# Patient Record
Sex: Female | Born: 1980 | Race: White | Hispanic: No | Marital: Married | Smoking: Never smoker
Health system: Southern US, Community
[De-identification: ages and names within clinical notes are randomized; demographics above are authoritative.]

## PROBLEM LIST (undated history)

## (undated) DIAGNOSIS — F419 Anxiety disorder, unspecified: Secondary | ICD-10-CM

## (undated) DIAGNOSIS — G40909 Epilepsy, unspecified, not intractable, without status epilepticus: Secondary | ICD-10-CM

## (undated) HISTORY — DX: Anxiety disorder, unspecified: F41.9

## (undated) HISTORY — PX: WISDOM TOOTH EXTRACTION: SHX21

## (undated) HISTORY — PX: OTHER SURGICAL HISTORY: SHX169

## (undated) HISTORY — DX: Epilepsy, unspecified, not intractable, without status epilepticus: G40.909

---

## 2006-07-19 ENCOUNTER — Other Ambulatory Visit: Admission: RE | Admit: 2006-07-19 | Discharge: 2006-07-19 | Payer: Self-pay | Admitting: Obstetrics & Gynecology

## 2007-11-09 ENCOUNTER — Other Ambulatory Visit: Admission: RE | Admit: 2007-11-09 | Discharge: 2007-11-09 | Payer: Self-pay | Admitting: Obstetrics & Gynecology

## 2013-04-26 ENCOUNTER — Encounter: Payer: Self-pay | Admitting: Obstetrics & Gynecology

## 2013-04-27 ENCOUNTER — Ambulatory Visit (INDEPENDENT_AMBULATORY_CARE_PROVIDER_SITE_OTHER): Payer: BC Managed Care – PPO | Admitting: Obstetrics & Gynecology

## 2013-04-27 ENCOUNTER — Encounter: Payer: Self-pay | Admitting: Obstetrics & Gynecology

## 2013-04-27 VITALS — BP 110/78 | HR 70 | Resp 18 | Ht 63.5 in | Wt 140.5 lb

## 2013-04-27 DIAGNOSIS — Z01419 Encounter for gynecological examination (general) (routine) without abnormal findings: Secondary | ICD-10-CM

## 2013-04-27 DIAGNOSIS — Z0000 Encounter for general adult medical examination without abnormal findings: Secondary | ICD-10-CM

## 2013-04-27 LAB — POCT URINALYSIS DIPSTICK
Blood, UA: NEGATIVE
Nitrite, UA: NEGATIVE
Protein, UA: NEGATIVE
Urobilinogen, UA: NEGATIVE

## 2013-04-27 NOTE — Progress Notes (Signed)
Patient ID: Madeline Figueroa, female   DOB: 1981/01/26, 32 y.o.   MRN: [id number]  32 y.o. G1P1 SingleCaucasianF here for annual exam.  Hasn't decided to proceed with trying for another pregnancy.  Daughter has an egg and peanut allergy.  Cycling a little bit about every 40 days.  Light flow.  No recent seizure activity.  Patient's last menstrual period was 03/15/2013.          Sexually active: yes  The current method of family planning is IUD--Mirena   Exercising: yes  Walks 30 minutes daily. Smoker:  no  Health Maintenance: Pap:  02-22-12 wnl:neg HR HPV History of abnormal Pap:  no MMG:  N/A Colonoscopy:  N/A BMD:   N/A TDaP:  2012 Screening Labs: none today, Hb today: 13.4, Urine today: Neg   reports that she has never smoked. She has never used smokeless tobacco. She reports that she drinks about 1.5 ounces of alcohol per week. She reports that she does not use illicit drugs.  Past Medical History  Diagnosis Date  . Epilepsy   . Anxiety     h/o anxiety/OCD    Past Surgical History  Procedure Laterality Date  . Wisdom tooth extraction    . Excision of wart      from hand, age 38  . Mirena      IUD removal 2011, reinsertion 2013  . Cesarean section      secondary FTP    Current Outpatient Prescriptions  Medication Sig Dispense Refill  . clonazePAM (KLONOPIN) 0.5 MG tablet Take 0.5 mg by mouth at bedtime.      . levETIRAcetam (KEPPRA) 250 MG tablet Take 250 mg by mouth. Taking 6 tabs BID      . levonorgestrel (MIRENA) 20 MCG/24HR IUD 1 each by Intrauterine route once.       No current facility-administered medications for this visit.    Family History  Problem Relation Age of Onset  . Skin cancer Father   . Skin cancer Mother   . Hypertension Mother   . Hypothyroidism Mother   . Asthma Mother   . Thyroid disease Mother   . Skin cancer Paternal Grandfather   . Osteoarthritis Paternal Grandfather   . Hypertension Maternal Grandmother   . Osteoporosis Maternal  Grandmother   . Osteoarthritis Maternal Grandmother   . Breast cancer Maternal Aunt   . Seizures Maternal Aunt   . Stroke Maternal Grandfather   . Osteoarthritis Paternal Grandmother     ROS:  Pertinent items are noted in HPI.  Otherwise, a comprehensive ROS was negative.  Exam:   BP 110/78  Pulse 70  Resp 18  Ht 5' 3.5" (1.613 m)  Wt 140 lb 8 oz (63.73 kg)  BMI 24.49 kg/m2  LMP 03/15/2013  Weight change: +7 lbs   Height: 5' 3.5" (161.3 cm)  Ht Readings from Last 3 Encounters:  04/27/13 5' 3.5" (1.613 m)    General appearance: alert, cooperative and appears stated age Head: Normocephalic, without obvious abnormality, atraumatic Neck: no adenopathy, supple, symmetrical, trachea midline and thyroid normal to inspection and palpation Lungs: clear to auscultation bilaterally Breasts: normal appearance, no masses or tenderness Heart: regular rate and rhythm Abdomen: soft, non-tender; bowel sounds normal; no masses,  no organomegaly Extremities: extremities normal, atraumatic, no cyanosis or edema Skin: Skin color, texture, turgor normal. No rashes or lesions Lymph nodes: Cervical, supraclavicular, and axillary nodes normal. No abnormal inguinal nodes palpated Neurologic: Grossly normal   Pelvic: External genitalia:  no  lesions              Urethra:  normal appearing urethra with no masses, tenderness or lesions              Bartholins and Skenes: normal                 Vagina: normal appearing vagina with normal color and discharge, no lesions              Cervix: no lesions              Pap taken: no Bimanual Exam:  Uterus:  normal size, contour, position, consistency, mobility, non-tender              Adnexa: normal adnexa               Rectovaginal: Confirms               Anus:  normal sphincter tone, no lesions  A:  Well Woman with normal exam Mirena placed 9/13 H/O anxiety/OCD H/O seizure disorder  P:   Mammogram starting age 66 pap smear with neg HR HPV 9/13 May  consider having IUD this year. return annually or prn  An After Visit Summary was printed and given to the patient.

## 2013-08-10 ENCOUNTER — Telehealth: Payer: Self-pay | Admitting: Obstetrics & Gynecology

## 2013-08-10 DIAGNOSIS — Z30431 Encounter for routine checking of intrauterine contraceptive device: Secondary | ICD-10-CM

## 2013-08-10 NOTE — Telephone Encounter (Signed)
Spoke with patient. Patient states that she would like to have her IUD removed so that her and her husband can begin trying to having another baby. States that she does not want IUD out prior to the end of May/ beginning of June due to a busy schedule. Order placed. Requesting early morning appointment.Appointment made for May 28th at 10:00 am with Dr.Miller. Patient verbalizes understanding and is agreeable.Ready for precert. Sabrina notified.  Routing to provider for final review. Patient agreeable to disposition. Will close encounter

## 2013-08-10 NOTE — Telephone Encounter (Signed)
Pt wants to schedule an appointment for IUD removal.

## 2013-10-05 ENCOUNTER — Ambulatory Visit (INDEPENDENT_AMBULATORY_CARE_PROVIDER_SITE_OTHER): Payer: BC Managed Care – PPO | Admitting: Obstetrics & Gynecology

## 2013-10-05 VITALS — BP 102/60 | HR 64 | Resp 16 | Ht 63.5 in | Wt 138.8 lb

## 2013-10-05 DIAGNOSIS — Z30432 Encounter for removal of intrauterine contraceptive device: Secondary | ICD-10-CM

## 2013-10-05 DIAGNOSIS — Z30431 Encounter for routine checking of intrauterine contraceptive device: Secondary | ICD-10-CM

## 2013-10-05 NOTE — Progress Notes (Signed)
33 y.o. G1P1 Married Caucasian female presents for IUD removal.  Pt is ready to start trying again.  She has had a couple of breakthrough seizures since Keppra dosage was lowered.  This was done after she stopped nursing.  Now dosage has been increased.  She is on 4mg  folic acid and a PNV.  She's been doing this about 6 weeks.   LMP:  Patient's last menstrual period was 09/14/2013.  Healthy female:  WNWD WF, NADPelvic exam: Vulva: normal female genitalia Vagina:normal vagina Cervix:Non-tender, Negative CMT, no lesions or redness, IUD strings seen Strings grasped with ringed forceps.  IUD removed without difficulty with one tug on strings.  IUD discarded. Uterus:normal shape, position and consistency   A: Removal of IUD today Seizure d/o  P: On 4mg  folic acid Will call when she has + pregnancy

## 2014-03-12 ENCOUNTER — Encounter: Payer: Self-pay | Admitting: Obstetrics & Gynecology

## 2014-04-02 ENCOUNTER — Telehealth: Payer: Self-pay | Admitting: Obstetrics & Gynecology

## 2014-04-02 DIAGNOSIS — Z30430 Encounter for insertion of intrauterine contraceptive device: Secondary | ICD-10-CM

## 2014-04-02 NOTE — Telephone Encounter (Signed)
Pt wants to schedule an appointment for an ultrasound

## 2014-04-02 NOTE — Telephone Encounter (Signed)
Spoke with patient. Patient states that she would like to have IUD replaced. Patient had IUD removed 10/05/2013 and was planning to try for pregnancy. "My husband and I have decided not to try right now and I would like to have my IUD replaced." Patient is currently having regular cycles monthly. Advised patient will need to call with first day of menses to get scheduled during the first 7 days of cycle for IUD insertion. Patient is agreeable. Advised order will be placed by Dr.Miller if approved and she will be contacted with OOP cost for IUD insertion. Patient is agreeable.   Dr.Miller, okay to place order for Mirena insertion?

## 2014-04-04 NOTE — Telephone Encounter (Signed)
Order placed for IUD insertion for precert. Patient aware to call with first day of next menses to schedule insertion.  Will close encounter.

## 2014-04-04 NOTE — Telephone Encounter (Signed)
Yes.  Agree with plan.  OK to close encounter.

## 2014-04-10 NOTE — Telephone Encounter (Signed)
PR: $0 °

## 2014-04-11 ENCOUNTER — Telehealth: Payer: Self-pay | Admitting: Obstetrics & Gynecology

## 2014-04-11 NOTE — Telephone Encounter (Signed)
Spoke with patient. Advised that per benefits quote received, IUD and insertion is covered at 100%. There will be 0 patient liability. Patient is to call within the first 5 days of her cycle to schedule insertion. °

## 2014-04-16 ENCOUNTER — Telehealth: Payer: Self-pay | Admitting: Obstetrics & Gynecology

## 2014-04-16 NOTE — Telephone Encounter (Signed)
Spoke with patient. Patient states that she started cycle today and would like to schedule appointment for IUD insertion. Appointment scheduled for tomorrow at 1pm with Dr.Miller (time per Kennon RoundsSally). Patient is agreeable to date and time.  Routing to provider for final review. Patient agreeable to disposition. Will close encounter

## 2014-04-16 NOTE — Telephone Encounter (Signed)
Pt scheduled for an iud and is calling to notify started cycle today.  bf

## 2014-04-16 NOTE — Telephone Encounter (Signed)
Left message to call Harlynn Kimbell at [phone].  Offer patient appointment tomorrow at 1pm with Dr.Miller for IUD insertion.

## 2014-04-17 ENCOUNTER — Ambulatory Visit: Payer: BLUE CROSS/BLUE SHIELD | Admitting: Obstetrics & Gynecology

## 2014-04-17 DIAGNOSIS — Z30430 Encounter for insertion of intrauterine contraceptive device: Secondary | ICD-10-CM

## 2014-04-17 NOTE — Progress Notes (Signed)
33 y.o. G1P1 Married Caucasian female presents for insertion of Mirena.  Pt having a lot of issues with break through seizures.  Feels like she is being an "experiment with medications".  Pt feels like just needs to be "sure" about her contraception right now.    She has been counseled about alternative forms but is not desirous of anything but Mirena IUD.  Currently, she denies any vaginal symptoms or STD concerns.  LMP:  Patient's last menstrual period was 04/16/2014.  Healthy female:  WNWD NAD  Pelvic exam: Vulva: normal female genitalia Vagina:normal vagina Cervix:Non-tender, Negative CMT, no lesions or redness Uterus:normal shape, position and consistency   After patient read information booklet and all questions were answered, informed consent was obtained.    Procedure:  Speculum inserted into vagina. Cervix visualized and cleansed with betadine solution X 3. Paracervical block was not placed.    Tenaculum placed on cervix at 12 o'clock position.  Uterus sounded to 9 centimeters.  IUD removed from sterile packet and under sterile conditions inserted to fundus of uterus.  Introducer removed without difficulty.  IUD string trimmed to 2 centimeters.  Remainder string given to patient to feel for identification.  Tenaculum removed.  minimal bleeding noted.  Speculum removed.  Uterus palpated normal.  Patient tolerated procedure well.  IUD Lot #:[id number].  Exp: 4/18.  Package information attached to consent and scanned into EPIC.  A: Insertion of Mirena IUD  P: Return to office 12 weeks for recheck Pt knows IUD needs to be replaced approximately 04/18/2019.  Instructions provided.

## 2014-04-17 NOTE — Progress Notes (Signed)
Patient took 800 mg of Ibuprofen at 1:35 pm

## 2014-04-20 ENCOUNTER — Ambulatory Visit (INDEPENDENT_AMBULATORY_CARE_PROVIDER_SITE_OTHER): Payer: BLUE CROSS/BLUE SHIELD | Admitting: Certified Nurse Midwife

## 2014-04-20 ENCOUNTER — Telehealth: Payer: Self-pay | Admitting: Obstetrics & Gynecology

## 2014-04-20 ENCOUNTER — Encounter: Payer: Self-pay | Admitting: Certified Nurse Midwife

## 2014-04-20 VITALS — BP 124/74 | HR 76 | Resp 16 | Ht 63.5 in | Wt 133.0 lb

## 2014-04-20 DIAGNOSIS — Z3049 Encounter for surveillance of other contraceptives: Secondary | ICD-10-CM

## 2014-04-20 DIAGNOSIS — N390 Urinary tract infection, site not specified: Secondary | ICD-10-CM

## 2014-04-20 MED ORDER — NITROFURANTOIN MONOHYD MACRO 100 MG PO CAPS: 100.0000 mg | ORAL_CAPSULE | Freq: Two times a day (BID) | ORAL | Status: DC

## 2014-04-20 NOTE — Telephone Encounter (Signed)
Spoke with patient. Patient had IUD inserted on 12/8 by Dr.Miller. Patient states she had slight cramping after IUD insertion which has gone away. Experienced sharp lower quadrant pain with laying down a couple of days ago that went away after she got up and moved around. Patient is unable to feel IUD strings.Since Wednesday patient has been experiencing increased urinary frequency. Denies any current pain, fevers, or chills. "My main concern is having to use the restroom all the time. I think I may have a UTI." Advised patient will need to be seen for evaluation. Patient is agreeable. Appointment scheduled for today at 2pm with Madeline Figueroa CNM. Patient is agreeable to date and time.  Cc: Dr.Miller  Routing to provider for final review. Patient agreeable to disposition. Will close encounter

## 2014-04-20 NOTE — Patient Instructions (Signed)

## 2014-04-20 NOTE — Progress Notes (Signed)
33 y.o. married white female g1p1001 here with complaint of UTI, with onset  3 days ago. Patient complaining of urinary frequency/urgency/ and discomfort wiith urination. Patient denies fever, chills, nausea or back pain. No new personal products. Patient feels not related to sexual activity. Denies any vaginal symptoms or new personal products. Contraception is Mirena. IUD Insertion 3 days ago.   O: Healthy female WDWN Affect: Normal, orientation x 3 Skin : warm and dry CVAT: negative bilateral Abdomen: positive for suprapubic tenderness  Pelvic exam: External genital area: normal, no lesions Bladder,Urethra, tender Urethral meatus: tender, slightly red Vagina: normal vaginal discharge, normal appearance  Wet prep Cervix: normal, non tender, IUD string noted appropriate length Uterus:normal,non tender Adnexa: normal non tender, no fullness or masses   A: UTI Poct urine-ph 8.0  Tr. Blood Mirena IUD  Normal surveillance P: Reviewed findings of symptoms consistent with  UTI NF:AOZHYQMVRx:Macrobid see order HQI:ONGEXLab:Urine micro, culture Reviewed warning signs and symptoms of UTI and need to advise if occurs.  Encouraged to limit soda, tea, and coffee and increase water intake daily.  RV 2 week if TOC needed for positive culture  RV prn

## 2014-04-20 NOTE — Telephone Encounter (Signed)
Patient calling requesting to speak with nurse about possible problems with her IUD.

## 2014-04-21 LAB — URINALYSIS, MICROSCOPIC ONLY
Bacteria, UA: NONE SEEN
Casts: NONE SEEN
Crystals: NONE SEEN
Squamous Epithelial / LPF: NONE SEEN

## 2014-04-22 LAB — URINE CULTURE
COLONY COUNT: NO GROWTH
ORGANISM ID, BACTERIA: NO GROWTH

## 2014-04-23 NOTE — Progress Notes (Signed)
Reviewed personally.  M. Suzanne Marbeth Smedley, MD.  

## 2014-05-24 ENCOUNTER — Ambulatory Visit (INDEPENDENT_AMBULATORY_CARE_PROVIDER_SITE_OTHER): Payer: BLUE CROSS/BLUE SHIELD | Admitting: Obstetrics & Gynecology

## 2014-05-24 ENCOUNTER — Encounter: Payer: Self-pay | Admitting: Obstetrics & Gynecology

## 2014-05-24 VITALS — BP 104/66 | HR 68 | Resp 16 | Ht 63.25 in | Wt 134.4 lb

## 2014-05-24 DIAGNOSIS — N390 Urinary tract infection, site not specified: Secondary | ICD-10-CM | POA: Insufficient documentation

## 2014-05-24 DIAGNOSIS — F428 Other obsessive-compulsive disorder: Secondary | ICD-10-CM | POA: Insufficient documentation

## 2014-05-24 DIAGNOSIS — N938 Other specified abnormal uterine and vaginal bleeding: Secondary | ICD-10-CM

## 2014-05-24 DIAGNOSIS — Z124 Encounter for screening for malignant neoplasm of cervix: Secondary | ICD-10-CM

## 2014-05-24 DIAGNOSIS — G40909 Epilepsy, unspecified, not intractable, without status epilepticus: Secondary | ICD-10-CM | POA: Insufficient documentation

## 2014-05-24 DIAGNOSIS — Z01419 Encounter for gynecological examination (general) (routine) without abnormal findings: Secondary | ICD-10-CM

## 2014-05-24 LAB — CBC WITH DIFFERENTIAL/PLATELET
Basophils Absolute: 0 10*3/uL (ref 0.0–0.1)
Basophils Relative: 0 % (ref 0–1)
EOS PCT: 1 % (ref 0–5)
Eosinophils Absolute: 0 10*3/uL (ref 0.0–0.7)
HCT: 43.5 % (ref 36.0–46.0)
HEMOGLOBIN: 14.5 g/dL (ref 12.0–15.0)
Lymphocytes Relative: 34 % (ref 12–46)
Lymphs Abs: 1.6 10*3/uL (ref 0.7–4.0)
MCH: 30.8 pg (ref 26.0–34.0)
MCHC: 33.3 g/dL (ref 30.0–36.0)
MCV: 92.4 fL (ref 78.0–100.0)
MPV: 11 fL (ref 8.6–12.4)
Monocytes Absolute: 0.4 10*3/uL (ref 0.1–1.0)
Monocytes Relative: 9 % (ref 3–12)
NEUTROS ABS: 2.6 10*3/uL (ref 1.7–7.7)
NEUTROS PCT: 56 % (ref 43–77)
Platelets: 182 10*3/uL (ref 150–400)
RBC: 4.71 MIL/uL (ref 3.87–5.11)
RDW: 12.8 % (ref 11.5–15.5)
WBC: 4.6 10*3/uL (ref 4.0–10.5)

## 2014-05-24 LAB — POCT URINALYSIS DIPSTICK
Bilirubin, UA: NEGATIVE
GLUCOSE UA: NEGATIVE
KETONES UA: NEGATIVE
Leukocytes, UA: NEGATIVE
NITRITE UA: NEGATIVE
Protein, UA: NEGATIVE
UROBILINOGEN UA: NEGATIVE
pH, UA: 5

## 2014-05-24 MED ORDER — FLUOXETINE HCL 10 MG PO TABS: 10.0000 mg | ORAL_TABLET | Freq: Every day | ORAL | Status: DC

## 2014-05-24 NOTE — Progress Notes (Signed)
Patient scheduled for Pel[facility] ultrasound [facility] in [place] for 05/25/14 at 1530. Patient given written order to take with her.

## 2014-05-24 NOTE — Progress Notes (Signed)
34 y.o. G1P1 MarriedCaucasianF here for annual exam.  Had a seizure 05/01/14 and she went off Keppra.  Depakote was increased.  Pt is going for a second opinion today.  Pt reports she had bleeding issues when her Depakote was increased.    Pt had IUD placed 04/17/14.  Pt has been bleeding/spotting since 05/11/14.  No pain.  No cramping.    Pt having some increased anxiety with driving.  Would like to consider starting something.  H/O OCD.  Usually does calming techniques but not helping with driving.  Recent increases in seizures is making her more anxious as well.  Still able to drive per neurologist recommendations.  Patient's last menstrual period was 05/11/2014.          Sexually active: Yes.    The current method of family planning is IUD.    Exercising: Yes.    walking, eliptical, and yoga Smoker:  no  Health Maintenance: Pap:  02/22/12 WNL/negative HR HPV History of abnormal Pap:  no MMG:  none Colonoscopy:  none BMD:   none TDaP:  2012 Screening Labs: CBC, Hb today: n/a, Urine today: RBC-trace   reports that she has never smoked. She has never used smokeless tobacco. She reports that she drinks about 1.2 - 1.8 oz of alcohol per week. She reports that she does not use illicit drugs.  Past Medical History  Diagnosis Date  . Epilepsy   . Anxiety     h/o anxiety/OCD    Past Surgical History  Procedure Laterality Date  . Wisdom tooth extraction    . Excision of wart      from hand, age 89  . Mirena      IUD removal 2011, reinsertion 2013-5/15, reinsertion 04/2014  . Cesarean section      secondary FTP    Current Outpatient Prescriptions  Medication Sig Dispense Refill  . divalproex (DEPAKOTE) 250 MG DR tablet Take 250 mg by mouth. Taking 750mg  twice daily     No current facility-administered medications for this visit.    Family History  Problem Relation Age of Onset  . Skin cancer Father   . Skin cancer Mother   . Hypertension Mother   . Hypothyroidism Mother   .  Asthma Mother   . Thyroid disease Mother   . Skin cancer Paternal Grandfather   . Osteoarthritis Paternal Grandfather   . Hypertension Maternal Grandmother   . Osteoporosis Maternal Grandmother   . Osteoarthritis Maternal Grandmother   . Breast cancer Maternal Aunt   . Seizures Maternal Aunt   . Stroke Maternal Grandfather   . Osteoarthritis Paternal Grandmother     ROS:  Pertinent items are noted in HPI.  Otherwise, a comprehensive ROS was negative.  Exam:   BP 104/66 mmHg  Pulse 68  Resp 16  Ht 5' 3.25" (1.607 m)  Wt 134 lb 6.4 oz (60.963 kg)  BMI 23.61 kg/m2  LMP 05/11/2014  Weight change: -6#  Height: 5' 3.25" (160.7 cm)  Ht Readings from Last 3 Encounters:  05/24/14 5' 3.25" (1.607 m)  04/20/14 5' 3.5" (1.613 m)  04/17/14 5' 3.5" (1.613 m)    General appearance: alert, cooperative and appears stated age Head: Normocephalic, without obvious abnormality, atraumatic Neck: no adenopathy, supple, symmetrical, trachea midline and thyroid normal to inspection and palpation Lungs: clear to auscultation bilaterally Breasts: normal appearance, no masses or tenderness Heart: regular rate and rhythm Abdomen: soft, non-tender; bowel sounds normal; no masses,  no organomegaly Extremities: extremities normal, atraumatic,  no cyanosis or edema Skin: Skin color, texture, turgor normal. No rashes or lesions Lymph nodes: Cervical, supraclavicular, and axillary nodes normal. No abnormal inguinal nodes palpated Neurologic: Grossly normal   Pelvic: External genitalia:  no lesions              Urethra:  normal appearing urethra with no masses, tenderness or lesions              Bartholins and Skenes: normal                 Vagina: normal appearing vagina with normal color and discharge, no lesions              Cervix: no lesions              Pap taken: Yes.   Bimanual Exam:  Uterus:  normal size, contour, position, consistency, mobility, non-tender              Adnexa: normal adnexa  and no mass, fullness, tenderness               Rectovaginal: Confirms               Anus:  normal sphincter tone, no lesions  Chaperone was present for exam.  A:  Well Woman with normal exam Mirena placed 12/15.  DUB. H/O anxiety/OCD H/O seizure disorder/epilepsy  P: Mammogram starting age 63 pap smear with neg HR HPV 9/13.  Pap obtained today. Plan PUS to check IUD placement due to DUB. Prozac  daily.  #30/12RF.  Pt will discuss with neurologist today. return annually or prn

## 2014-05-25 ENCOUNTER — Telehealth: Payer: Self-pay | Admitting: Obstetrics & Gynecology

## 2014-05-25 ENCOUNTER — Telehealth: Payer: Self-pay

## 2014-05-25 DIAGNOSIS — N938 Other specified abnormal uterine and vaginal bleeding: Secondary | ICD-10-CM

## 2014-05-25 NOTE — Telephone Encounter (Signed)
Patient calling with names of the tests that need processed with her labs yesterday per her neurologist, [place], [place] . DX Long term medication use. NEC (icd-z79.899) (icd-10 z79.899).  [facility] Phone: [phone] Fax: [phone]  Per patient: [id number] cpt 8502 cbc with differential and platelets Lav  Order #: [id number]

## 2014-05-25 NOTE — Telephone Encounter (Signed)
Patient was scheduled for an ultrasound today [facility]. Dianne from that office called and said the patient did not want to proceed with the visit due to the cost.   Patient got on the line and requests we check her insurance benefits so she can be seen here instead.   Patient also requests a call from the nurse to speak with her about "the medical necessity" of the appointment.   Cc: Martie LeeSabrina

## 2014-05-25 NOTE — Telephone Encounter (Signed)
Left detailed message to call back and notified of results, ok per DPR,. Need more information on lab request.//kn

## 2014-05-25 NOTE — Telephone Encounter (Signed)
Spoke with patient. Advised patient does need ultrasound to check placement of IUD. Advised this is important due to bleeding she has been having. Patient is agreeable. Advised order has been placed and will be precerted. Advised will receive all about OOP cost if she will owe anything more than standard office visit copay. Patient is agreeable. Advised patient of results as seen below. Patient is agreeable. States that she needed CBC with Differential and platelets done per neurologist. Provided information from order with neurology below. Advised patient did have CBC with differential at office visit which included platelet. Advised do not think anything further needs to be added but I will let Dr.Miller know. Patient requesting labs be sent to neurologist at fax number below. Labs faxed to number provided with cover sheet and fax confirmation. Starla Curl at 05/25/2014 2:22 PM     Status: Signed       Expand All Collapse All   Patient calling with names of the tests that need processed with her labs yesterday per her neurologist, [place], [place] . DX Long term medication use. NEC (icd-z79.899) (icd-10 z79.899).  [facility] Phone: [phone] Fax: [phone]  Per patient: [id number] cpt 8502 cbc with differential and platelets Lav  Order #: [id number]     Routing to provider for final review. Patient agreeable to disposition. Will close encounter

## 2014-05-25 NOTE — Telephone Encounter (Signed)
-----   Message from Annamaria BootsMary Suzanne Miller, MD sent at 05/25/2014 10:36 AM EST ----- Inform normal CBC.  She didn't call back with other labs that needed to be done.

## 2014-05-25 NOTE — Telephone Encounter (Signed)
lmtcb-need additional information re: lab request.//kn

## 2014-05-25 NOTE — Telephone Encounter (Signed)
Spoke with patient. Results given. Please see additional phone call from today with all information regarding labs.  Routing to provider for final review. Patient agreeable to disposition. Will close encounter

## 2014-05-25 NOTE — Telephone Encounter (Signed)
Pt returning call

## 2014-05-28 LAB — IPS PAP TEST WITH REFLEX TO HPV

## 2014-05-28 NOTE — Telephone Encounter (Signed)
Patient spoke with K. Hines, RN and all information was given. No further testing needed from our office at this time.//kn

## 2014-05-29 ENCOUNTER — Telehealth: Payer: Self-pay | Admitting: Obstetrics & Gynecology

## 2014-05-29 NOTE — Telephone Encounter (Signed)
Spoke with patient. Scheduled PUS. Advised that I am unable to quote out of pocket expectation today because her insurance company has closed the phone lines for the day. Will contact her if oop is more than a copay. Patient agreeable. Advised patient of 72 hour cancellation policy and $100 cancellation fee. Patient agreeable.

## 2014-05-29 NOTE — Telephone Encounter (Signed)
Patient is ready to schedule her ultrasound. Last seen 05/24/14.

## 2014-06-04 NOTE — Telephone Encounter (Signed)
Patient is returning a call to Sabrina °

## 2014-06-04 NOTE — Telephone Encounter (Signed)
Left message for patient to call back. Need to discuss payment for appt scheduled 01.28.2016.

## 2014-06-04 NOTE — Telephone Encounter (Signed)
Spoke with patient. Advised that we are unable to verify her coverage at this time due to ongoing issues with her payor ([employer]). Advised that since we cannot confirm coverage, we will need to collect the allowable for her procedure ($462.38) at the time of the service. Explained that once her insurance company has had a chance to process her claim, if she is due a refund, we will have no problem issuing it. Patient states that she is not financially capable of payment in full at the time of service and cannot reschedule because [place] to [place]. States that the procedure has already been pushed back and she is now becoming concerned. Patient would like to proceed with monthly payment plan. Advised that our office does not set up payment plans. Patient requests that we make an exception. Advised that I would talk with Office Administration and call her back.

## 2014-06-05 NOTE — Telephone Encounter (Signed)
Okay to close encounter?  °

## 2014-06-05 NOTE — Telephone Encounter (Signed)
Spoke with patient. Advised that per conversation with office administration, we are willing to make an arrangement with her to pay for her PUS. Offered patient to make 50% payment upfront ($231.19) and to pay off the balance within 30 days. Patient states that she cannot afford this payment arrangment. I advised that since she cannot, we reschedule her appointment until we are able to verify her coverage to see if her out of pocket expectation would be less than what we are collecting now. Patient declines. Patient stated that she will just have the PUS performed "here" (assuming that is where she lives) because this arrangement is not affordable.   She also stated that she does not feel as though her circumstances were reviewed or handled professionally.  She feels as though we are unwilling to work with her. I advised that we are trying to work with her and this is why the arrangement was presented. She states "this is not a payment plan, this is a deal. A payment plan is when you work with the patient to see what will work for them and then allow them to pay off over time..I will be sending Dr Hyacinth MeekerMiller a note about this"

## 2014-06-05 NOTE — Telephone Encounter (Signed)
Encounter closed

## 2014-06-05 NOTE — Telephone Encounter (Signed)
Financial issue. Reviewed with Dr Hyacinth MeekerMiller, administrator to call patient per office policy.  Routing to provider for final review. Patient agreeable to disposition. Will close encounter

## 2014-06-05 NOTE — Telephone Encounter (Signed)
Patient calling requesting to speak with Dr. Hyacinth MeekerMiller. Routing to [place] for assistance coordinating with Dr. Hyacinth MeekerMiller.

## 2014-06-07 ENCOUNTER — Other Ambulatory Visit: Payer: BLUE CROSS/BLUE SHIELD | Admitting: Obstetrics & Gynecology

## 2014-06-07 ENCOUNTER — Other Ambulatory Visit: Payer: BLUE CROSS/BLUE SHIELD

## 2014-06-07 ENCOUNTER — Telehealth: Payer: Self-pay | Admitting: Obstetrics & Gynecology

## 2014-06-07 NOTE — Telephone Encounter (Signed)
Patient had a PUS at  diagnostics and is wondering what her next step should be? Last seen 05/24/14.

## 2014-06-07 NOTE — Telephone Encounter (Signed)
Pelvic ultrasound results received and reviewed by Dr. Hyacinth MeekerMiller. Prior Dr. Hyacinth MeekerMiller,  IUD present in endometrial cavity. No problems identified. Functional ovarian cysts noted that may cause discomfort and should resolve spontaneously. Continue to monitor bleeding and call if increases or persists. Patient states she feels bleeding is just related to body adjusting to new IUD that was inserted 04/17/2014. States bleeding is not heavy, mostly just spotting. It mostly just bothersome. Patient call as needed.  Routing to provider for final review. Patient agreeable to disposition. Will close encounter

## 2014-07-10 ENCOUNTER — Telehealth: Payer: Self-pay | Admitting: Obstetrics & Gynecology

## 2014-07-10 NOTE — Telephone Encounter (Signed)
Spoke with patient. Patient states "I am not having any further problems since I had the ultrasound. I am having light spotting very randomly but no other problems. My doctor has decreased my depakote and sometimes I have spotting with this. I do not think it is related to the IUD." Denies any pain or discomfort. Patient had IUD inserted on 04/17/2014, ultrasound for bleeding with IUD on 06/06/2014, has current 3 month follow up scheduled for 3/11 with Dr.Miller. Advised will need to speak with provider regarding appointment and return call with further recommendations.

## 2014-07-10 NOTE — Telephone Encounter (Signed)
Patient calling to be sure she needs her appointment with Dr. Hyacinth MeekerMiller for IUD recheck on 07/20/14. She said she has already had some imaging where she lives and everything is "fine." This appointment has not been cancelled yet.

## 2014-07-10 NOTE — Telephone Encounter (Signed)
OK to cancel appt.  Please just let her know if the spotting gets worse or does not resolve by six months after the IUD was placed, I do want to see her.  Thanks.

## 2014-07-11 NOTE — Telephone Encounter (Signed)
Spoke with patient. Advised patient of message as seen below from Dr.Miller. Patient is agreeable and verbalizes understanding. Will call if spotting worsens or does not resolve by June to schedule follow up appointment.  Routing to provider for final review. Patient agreeable to disposition. Will close encounter

## 2014-07-11 NOTE — Telephone Encounter (Signed)
Left message to call Kaitlyn at [phone]. 

## 2014-07-20 ENCOUNTER — Ambulatory Visit: Payer: BC Managed Care – PPO | Admitting: Obstetrics & Gynecology

## 2014-10-09 DIAGNOSIS — F419 Anxiety disorder, unspecified: Secondary | ICD-10-CM | POA: Insufficient documentation

## 2015-07-19 ENCOUNTER — Ambulatory Visit: Payer: BLUE CROSS/BLUE SHIELD | Admitting: Obstetrics & Gynecology

## 2015-08-22 ENCOUNTER — Encounter: Payer: Self-pay | Admitting: Obstetrics & Gynecology

## 2015-08-22 ENCOUNTER — Ambulatory Visit (INDEPENDENT_AMBULATORY_CARE_PROVIDER_SITE_OTHER): Payer: BLUE CROSS/BLUE SHIELD | Admitting: Obstetrics & Gynecology

## 2015-08-22 VITALS — BP 90/56 | HR 74 | Resp 16 | Ht 63.75 in | Wt 141.0 lb

## 2015-08-22 DIAGNOSIS — Z01419 Encounter for gynecological examination (general) (routine) without abnormal findings: Secondary | ICD-10-CM | POA: Diagnosis not present

## 2015-08-22 NOTE — Progress Notes (Signed)
35 y.o. G1P1 MarriedCaucasianF here for annual exam.  Doing well.  Has light cycles with her IUD.  Madeline Figueroa is almost [age].    Patient's last menstrual period was 07/26/2015.          Sexually active: Yes.    The current method of family planning is IUD Mirena placed 04/17/2014 Exercising: Yes.    Elliptical, resistance training, walking, bike Smoker:  no  Health Maintenance: Pap:  05/24/14 Neg. 02/22/12 Neg. HR HPV:neg History of abnormal Pap:  no MMG:  Never TDaP:  2012 Screening Labs: PCP, Urine today: PCP   reports that she has never smoked. She has never used smokeless tobacco. She reports that she drinks about 1.2 - 1.8 oz of alcohol per week. She reports that she does not use illicit drugs.  Past Medical History  Diagnosis Date  . Epilepsy (HCC)   . Anxiety     h/o anxiety/OCD    Past Surgical History  Procedure Laterality Date  . Wisdom tooth extraction    . Excision of wart      from hand, age 66  . Mirena      IUD removal 2011, reinsertion 2013-5/15, reinsertion 04/2014  . Cesarean section      secondary FTP    Current Outpatient Prescriptions  Medication Sig Dispense Refill  . DEPAKOTE 500 MG DR tablet Take 1 tablet by mouth daily.  10  . levonorgestrel (MIRENA) 20 MCG/24HR IUD by Intrauterine route. Placed 04/17/2014     No current facility-administered medications for this visit.    Family History  Problem Relation Age of Onset  . Skin cancer Father   . Skin cancer Mother   . Hypertension Mother   . Hypothyroidism Mother   . Asthma Mother   . Thyroid disease Mother   . Skin cancer Paternal Grandfather   . Osteoarthritis Paternal Grandfather   . Hypertension Maternal Grandmother   . Osteoporosis Maternal Grandmother   . Osteoarthritis Maternal Grandmother   . Breast cancer Maternal Aunt   . Seizures Maternal Aunt   . Stroke Maternal Grandfather   . Osteoarthritis Paternal Grandmother     ROS:  Pertinent items are noted in HPI.  Otherwise, a  comprehensive ROS was negative.  Exam:   BP 90/56 mmHg  Pulse 74  Resp 16  Ht 5' 3.75" (1.619 m)  Wt 141 lb (63.957 kg)  BMI 24.40 kg/m2  LMP 07/26/2015  Weight change: +7# Height: 5' 3.75" (161.9 cm)  Ht Readings from Last 3 Encounters:  08/22/15 5' 3.75" (1.619 m)  05/24/14 5' 3.25" (1.607 m)  04/20/14 5' 3.5" (1.613 m)    General appearance: alert, cooperative and appears stated age Head: Normocephalic, without obvious abnormality, atraumatic Neck: no adenopathy, supple, symmetrical, trachea midline and thyroid normal to inspection and palpation Lungs: clear to auscultation bilaterally Breasts: normal appearance, no masses or tenderness Heart: regular rate and rhythm Abdomen: soft, non-tender; bowel sounds normal; no masses,  no organomegaly Extremities: extremities normal, atraumatic, no cyanosis or edema Skin: Skin color, texture, turgor normal. No rashes or lesions Lymph nodes: Cervical, supraclavicular, and axillary nodes normal. No abnormal inguinal nodes palpated Neurologic: Grossly normal   Pelvic: External genitalia:  no lesions              Urethra:  normal appearing urethra with no masses, tenderness or lesions              Bartholins and Skenes: normal  Vagina: normal appearing vagina with normal color and discharge, no lesions              Cervix: no lesions              Pap taken: No. Bimanual Exam:  Uterus:  normal size, contour, position, consistency, mobility, non-tender              Adnexa: normal adnexa and no mass, fullness, tenderness               Rectovaginal: Confirms               Anus:  normal sphincter tone, no lesions  Chaperone was present for exam.   A: Well Woman with normal exam Mirena placed 12/15. Cycles have normalized.  H/O anxiety/OCD H/O seizure disorder/epilepsy.  Last seizure 9/16.  P: Mammogram starting age 35 pap smear with neg HR HPV 9/13. Pap 2016.  No pap today. [facility].  Will do blood work then. return annually or prn

## 2016-10-23 ENCOUNTER — Ambulatory Visit: Payer: BLUE CROSS/BLUE SHIELD | Admitting: Obstetrics & Gynecology

## 2016-12-04 ENCOUNTER — Ambulatory Visit: Payer: BLUE CROSS/BLUE SHIELD | Admitting: Obstetrics & Gynecology

## 2016-12-07 ENCOUNTER — Encounter: Payer: Self-pay | Admitting: Obstetrics & Gynecology

## 2016-12-07 ENCOUNTER — Other Ambulatory Visit (HOSPITAL_COMMUNITY)
Admission: RE | Admit: 2016-12-07 | Discharge: 2016-12-07 | Disposition: A | Discharge status: Home / Self Care | Payer: BLUE CROSS/BLUE SHIELD | Source: Ambulatory Visit | Attending: Obstetrics & Gynecology | Admitting: Obstetrics & Gynecology

## 2016-12-07 ENCOUNTER — Ambulatory Visit (INDEPENDENT_AMBULATORY_CARE_PROVIDER_SITE_OTHER): Payer: BLUE CROSS/BLUE SHIELD | Admitting: Obstetrics & Gynecology

## 2016-12-07 VITALS — BP 100/70 | HR 78 | Resp 16 | Ht 63.5 in | Wt 142.0 lb

## 2016-12-07 DIAGNOSIS — Z01419 Encounter for gynecological examination (general) (routine) without abnormal findings: Secondary | ICD-10-CM | POA: Diagnosis not present

## 2016-12-07 DIAGNOSIS — Z124 Encounter for screening for malignant neoplasm of cervix: Secondary | ICD-10-CM | POA: Diagnosis not present

## 2016-12-07 NOTE — Progress Notes (Addendum)
36 y.o. G1P1 MarriedCaucasianF here for annual exam.  She and spouse just separated.  She states this has been good and amiable.  Selling her home.  [place] Livingsllie is starting kindergarten this year.  Does have occ spotting with her IUD.  This was placed 04/17/14.  Is dating someone and this is really great.    Patient's last menstrual period was 10/09/2016 (approximate).          Sexually active: Yes.    The current method of family planning is IUD. Mirena placed 04/17/14     Exercising: Yes.    walking, elliptical  Smoker:  no  Health Maintenance: Pap:  05/24/14 Neg   02/22/12 Neg. HR HPV:Neg   History of abnormal Pap:  no MMG:  never TDaP: 07/2011  Screening Labs: PCP   reports that she has never smoked. She has never used smokeless tobacco. She reports that she drinks about 1.2 - 1.8 oz of alcohol per week . She reports that she does not use drugs.  Past Medical History:  Diagnosis Date  . Anxiety    h/o anxiety/OCD  . Epilepsy [facility](HCC)     Past Surgical History:  Procedure Laterality Date  . CESAREAN SECTION     secondary FTP  . excision of wart     from hand, age 89  . mirena     IUD removal 2011, reinsertion 2013-5/15, reinsertion 04/2014  . WISDOM TOOTH EXTRACTION      Current Outpatient Prescriptions  Medication Sig Dispense Refill  . divalproex (DEPAKOTE) 500 MG DR tablet Take 1 tablet by mouth 2 (two) times daily.    Marland Kitchen. levonorgestrel (MIRENA) 20 MCG/24HR IUD by Intrauterine route. Placed 04/17/2014     No current facility-administered medications for this visit.     Family History  Problem Relation Age of Onset  . Skin cancer Father   . Skin cancer Mother   . Hypertension Mother   . Hypothyroidism Mother   . Asthma Mother   . Thyroid disease Mother   . Skin cancer Paternal Grandfather   . Osteoarthritis Paternal Grandfather   . Hypertension Maternal Grandmother   . Osteoporosis Maternal Grandmother   . Osteoarthritis Maternal Grandmother   . Breast cancer Maternal  Aunt   . Seizures Maternal Aunt   . Stroke Maternal Grandfather   . Osteoarthritis Paternal Grandmother     ROS:  Pertinent items are noted in HPI.  Otherwise, a comprehensive ROS was negative.  Exam:   BP 100/70 (BP Location: Right Arm, Patient Position: Sitting, Cuff Size: Normal)   Pulse 78   Resp 16   Ht 5' 3.5" (1.613 m)   Wt 142 lb (64.4 kg)   LMP 10/09/2016 (Approximate)   BMI 24.76 kg/m   Weight change: +1#   Height: 5' 3.5" (161.3 cm)  Ht Readings from Last 3 Encounters:  12/07/16 5' 3.5" (1.613 m)  08/22/15 5' 3.75" (1.619 m)  05/24/14 5' 3.25" (1.607 m)    General appearance: alert, cooperative and appears stated age Head: Normocephalic, without obvious abnormality, atraumatic Neck: no adenopathy, supple, symmetrical, trachea midline and thyroid normal to inspection and palpation Lungs: clear to auscultation bilaterally Breasts: normal appearance, no masses or tenderness Heart: regular rate and rhythm Abdomen: soft, non-tender; bowel sounds normal; no masses,  no organomegaly Extremities: extremities normal, atraumatic, no cyanosis or edema Skin: Skin color, texture, turgor normal. No rashes or lesions Lymph nodes: Cervical, supraclavicular, and axillary nodes normal. No abnormal inguinal nodes palpated Neurologic: Grossly normal  Pelvic: External genitalia:  no lesions              Urethra:  normal appearing urethra with no masses, tenderness or lesions              Bartholins and Skenes: normal                 Vagina: normal appearing vagina with normal color and discharge, no lesions              Cervix: no lesions, IUD string not noted              Pap taken: Yes.   Bimanual Exam:  Uterus:  normal size, contour, position, consistency, mobility, non-tender              Adnexa: normal adnexa and no mass, fullness, tenderness               Rectovaginal: Confirms               Anus:  normal sphincter tone, no lesions  Chaperone was present for exam.  A:   Well Woman with normal exam Mirena placed 12/15.  Has minimal spotting.  No IUD string noted.  Pt had PUS 2016. H/O anxiety/OCD H/O seizure disorder/epilespy Divorcing  P:   Mammogram guidelines reviewed pap smear and HR HPV obtained today Seeing therapist regularly No blood work today Return annually or prn

## 2016-12-09 LAB — CYTOLOGY - PAP
DIAGNOSIS: NEGATIVE
HPV: NOT DETECTED

## 2017-12-28 ENCOUNTER — Encounter: Payer: Self-pay | Admitting: Obstetrics & Gynecology

## 2017-12-28 ENCOUNTER — Ambulatory Visit: Payer: BLUE CROSS/BLUE SHIELD | Admitting: Obstetrics & Gynecology

## 2017-12-28 ENCOUNTER — Other Ambulatory Visit: Payer: Self-pay

## 2017-12-28 VITALS — BP 102/70 | HR 76 | Resp 14 | Ht 63.75 in | Wt 139.2 lb

## 2017-12-28 DIAGNOSIS — Z113 Encounter for screening for infections with a predominantly sexual mode of transmission: Secondary | ICD-10-CM

## 2017-12-28 DIAGNOSIS — Z01419 Encounter for gynecological examination (general) (routine) without abnormal findings: Secondary | ICD-10-CM

## 2017-12-28 NOTE — Progress Notes (Addendum)
37 y.o. G1P1 Legally SeparatedCaucasianF here for annual exam.  Doing well.  Dating someone.  Reports his ex-wife had hx of HSV.  He's never had symptoms.  Pt wants to discuss testing.  D/w pt Gardisil.    Patient's last menstrual period was 11/08/2017 (approximate).          Sexually active: Yes.    The current method of family planning is Mirena IUD placed 04/17/14.    Exercising: Yes.    walking Smoker:  no  Health Maintenance: Pap:  12/07/16 Neg. HR HPV:neg   05/24/14 neg  History of abnormal Pap:  no MMG:  Never TDaP:  2013 Screening Labs: PCP   reports that she has never smoked. She has never used smokeless tobacco. She reports that she drinks about 5.0 standard drinks of alcohol per week. She reports that she does not use drugs.  Past Medical History:  Diagnosis Date  . Anxiety    h/o anxiety/OCD  . Epilepsy [facility](HCC)     Past Surgical History:  Procedure Laterality Date  . CESAREAN SECTION     secondary FTP  . excision of wart     from hand, age 89  . mirena     IUD removal 2011, reinsertion 2013-5/15, reinsertion 04/2014  . WISDOM TOOTH EXTRACTION      Current Outpatient Medications  Medication Sig Dispense Refill  . divalproex (DEPAKOTE) 500 MG DR tablet Take 1 tablet by mouth 2 (two) times daily.    . fluticasone (FLONASE) 50 MCG/ACT nasal spray Place into both nostrils daily.    Marland Kitchen. levonorgestrel (MIRENA) 20 MCG/24HR IUD by Intrauterine route. Placed 04/17/2014    . terbinafine (LAMISIL) 250 MG tablet Take 1 tablet by mouth daily.     No current facility-administered medications for this visit.     Family History  Problem Relation Age of Onset  . Skin cancer Father   . Skin cancer Mother   . Hypertension Mother   . Hypothyroidism Mother   . Asthma Mother   . Thyroid disease Mother   . Skin cancer Paternal Grandfather   . Osteoarthritis Paternal Grandfather   . Hypertension Maternal Grandmother   . Osteoporosis Maternal Grandmother   . Osteoarthritis  Maternal Grandmother   . Breast cancer Maternal Aunt   . Seizures Maternal Aunt   . Stroke Maternal Grandfather   . Osteoarthritis Paternal Grandmother     Review of Systems  All other systems reviewed and are negative.   Exam:   BP 102/70 (BP Location: Right Arm, Patient Position: Sitting, Cuff Size: Normal)   Pulse 76   Resp 14   Ht 5' 3.75" (1.619 m)   Wt 139 lb 3.2 oz (63.1 kg)   LMP 11/08/2017 (Approximate)   BMI 24.08 kg/m    Height: 5' 3.75" (161.9 cm)  Ht Readings from Last 3 Encounters:  12/28/17 5' 3.75" (1.619 m)  12/07/16 5' 3.5" (1.613 m)  08/22/15 5' 3.75" (1.619 m)    General appearance: alert, cooperative and appears stated age Head: Normocephalic, without obvious abnormality, atraumatic Neck: no adenopathy, supple, symmetrical, trachea midline and thyroid normal to inspection and palpation Lungs: clear to auscultation bilaterally Breasts: normal appearance, no masses or tenderness Heart: regular rate and rhythm Abdomen: soft, non-tender; bowel sounds normal; no masses,  no organomegaly Extremities: extremities normal, atraumatic, no cyanosis or edema Skin: Skin color, texture, turgor normal. No rashes or lesions Lymph nodes: Cervical, supraclavicular, and axillary nodes normal. No abnormal inguinal nodes palpated Neurologic: Grossly normal  Pelvic: External genitalia:  no lesions              Urethra:  normal appearing urethra with no masses, tenderness or lesions              Bartholins and Skenes: normal                 Vagina: normal appearing vagina with normal color and discharge, no lesions              Cervix: no lesions              Pap taken: No. Bimanual Exam:  Uterus:  normal size, contour, position, consistency, mobility, non-tender              Adnexa: normal adnexa and no mass, fullness, tenderness               Rectovaginal: Confirms               Anus:  normal sphincter tone, no lesions  Chaperone was present for exam.  A:  Well  Woman with normal exam Epilepsy OCD Anxiety IUD placed 12/15  P:   Mammogram guidelines reviewed pap smear with neg HR HPV 2018.  Not obtained today. Sees neurology, PCP, psychologist regularly Blood work done with PCP earlier this year Pt will check about Gardisil coverage and may want to start this series. GC/Chl obtained today Return annually or prn

## 2017-12-28 NOTE — Addendum Note (Signed)
Addended by: Loreta AveMORALES, REINA C on: 12/28/2017 11:32 AM   Modules accepted: Orders

## 2017-12-29 LAB — GC/CHLAMYDIA PROBE AMP
Chlamydia trachomatis, NAA: NEGATIVE
Neisseria gonorrhoeae by PCR: NEGATIVE

## 2018-02-08 ENCOUNTER — Ambulatory Visit: Payer: BLUE CROSS/BLUE SHIELD | Admitting: Obstetrics & Gynecology

## 2018-02-08 ENCOUNTER — Encounter

## 2019-02-08 ENCOUNTER — Encounter: Payer: Self-pay | Admitting: Obstetrics & Gynecology

## 2019-04-13 ENCOUNTER — Other Ambulatory Visit: Payer: Self-pay

## 2019-04-14 ENCOUNTER — Other Ambulatory Visit: Payer: Self-pay

## 2019-04-14 ENCOUNTER — Telehealth: Payer: Self-pay | Admitting: Obstetrics & Gynecology

## 2019-04-14 DIAGNOSIS — Z30433 Encounter for removal and reinsertion of intrauterine contraceptive device: Secondary | ICD-10-CM

## 2019-04-14 DIAGNOSIS — Z01419 Encounter for gynecological examination (general) (routine) without abnormal findings: Secondary | ICD-10-CM

## 2019-04-14 NOTE — Telephone Encounter (Signed)
Call placed to convey benefits for Mirena exchange. Spoke with the patient she understands/agreeable with the benefits. Patient is aware of the cancellation policy. Appointment scheduled 04/18/19.

## 2019-04-18 ENCOUNTER — Ambulatory Visit (INDEPENDENT_AMBULATORY_CARE_PROVIDER_SITE_OTHER): Payer: BC Managed Care – PPO | Admitting: Obstetrics & Gynecology

## 2019-04-18 ENCOUNTER — Encounter: Payer: Self-pay | Admitting: Obstetrics & Gynecology

## 2019-04-18 ENCOUNTER — Ambulatory Visit: Payer: BC Managed Care – PPO | Admitting: Obstetrics & Gynecology

## 2019-04-18 ENCOUNTER — Other Ambulatory Visit: Payer: Self-pay

## 2019-04-18 ENCOUNTER — Encounter

## 2019-04-18 ENCOUNTER — Ambulatory Visit: Payer: BC Managed Care – PPO

## 2019-04-18 VITALS — BP 118/60 | HR 76 | Temp 97.2°F | Resp 12 | Ht 63.0 in | Wt 134.8 lb

## 2019-04-18 DIAGNOSIS — Z01419 Encounter for gynecological examination (general) (routine) without abnormal findings: Secondary | ICD-10-CM

## 2019-04-18 DIAGNOSIS — Z0000 Encounter for general adult medical examination without abnormal findings: Secondary | ICD-10-CM

## 2019-04-18 DIAGNOSIS — Z30433 Encounter for removal and reinsertion of intrauterine contraceptive device: Secondary | ICD-10-CM | POA: Diagnosis not present

## 2019-04-18 DIAGNOSIS — Z01812 Encounter for preprocedural laboratory examination: Secondary | ICD-10-CM

## 2019-04-18 LAB — POCT URINE PREGNANCY: Preg Test, Ur: NEGATIVE

## 2019-04-18 NOTE — Progress Notes (Signed)
38 y.o. G1P1 Significant Other White or Caucasian female here for annual exam.  She is just recently engaged.  They just bought a house in [place].  IUD needs to be removed and replaced today.  She does need her IUD removed and replaced today.  Desires to continue using mirena IUD.  Consent obtained.  Risks reviewed.  Patient's last menstrual period was 04/05/2019 (within days).          Sexually active: Yes.    The current method of family planning is Mirena 04/17/14.  Exercising: Yes.    walking Smoker:  no  Health Maintenance: Pap:  12/07/16 Neg. HR HPV:neg              05/24/14 neg  History of abnormal Pap:  no MMG:  Guidelines reviewed.  Will plan to do this when she has next appt her in [place]:  07/16/11 Screening Labs: discuss today if needed   reports that she has never smoked. She has never used smokeless tobacco. She reports current alcohol use of about 5.0 standard drinks of alcohol per week. She reports that she does not use drugs.  Past Medical History:  Diagnosis Date  . Anxiety    h/o anxiety/OCD  . Epilepsy [facility])     Past Surgical History:  Procedure Laterality Date  . CESAREAN SECTION     secondary FTP  . excision of wart     from hand, age 39  . mirena     IUD removal 2011, reinsertion 2013-5/15, reinsertion 04/2014  . WISDOM TOOTH EXTRACTION      Current Outpatient Medications  Medication Sig Dispense Refill  . divalproex (DEPAKOTE) 500 MG DR tablet Take by mouth.    . levonorgestrel (MIRENA) 20 MCG/24HR IUD by Intrauterine route. Placed 04/17/2014    . fluticasone (FLONASE) 50 MCG/ACT nasal spray Place into both nostrils daily.     No current facility-administered medications for this visit.     Family History  Problem Relation Age of Onset  . Skin cancer Father   . Skin cancer Mother   . Hypertension Mother   . Hypothyroidism Mother   . Asthma Mother   . Thyroid disease Mother   . Skin cancer Paternal Grandfather   . Osteoarthritis Paternal  Grandfather   . Hypertension Maternal Grandmother   . Osteoporosis Maternal Grandmother   . Osteoarthritis Maternal Grandmother   . Breast cancer Maternal Aunt   . Seizures Maternal Aunt   . Stroke Maternal Grandfather   . Osteoarthritis Paternal Grandmother     Review of Systems  All other systems reviewed and are negative.   Exam:   BP 118/60 (BP Location: Right Arm, Patient Position: Sitting, Cuff Size: Normal)   Pulse 76   Temp (!) 97.2 F (36.2 C) (Temporal)   Resp 12   Ht 5\' 3"  (1.6 m)   Wt 134 lb 12.8 oz (61.1 kg)   LMP 04/05/2019 (Within Days)   BMI 23.88 kg/m     Height: 5\' 3"  (160 cm)  Ht Readings from Last 3 Encounters:  04/18/19 5\' 3"  (1.6 m)  12/28/17 5' 3.75" (1.619 m)  12/07/16 5' 3.5" (1.613 m)    General appearance: alert, cooperative and appears stated age Head: Normocephalic, without obvious abnormality, atraumatic Neck: no adenopathy, supple, symmetrical, trachea midline and thyroid normal to inspection and palpation Lungs: clear to auscultation bilaterally Breasts: normal appearance, no masses or tenderness Heart: regular rate and rhythm Abdomen: soft, non-tender; bowel sounds normal; no masses,  no organomegaly Extremities: extremities normal, atraumatic, no cyanosis or edema Skin: Skin color, texture, turgor normal. No rashes or lesions Lymph nodes: Cervical, supraclavicular, and axillary nodes normal. No abnormal inguinal nodes palpated Neurologic: Grossly normal   Pelvic: External genitalia:  no lesions              Urethra:  normal appearing urethra with no masses, tenderness or lesions              Bartholins and Skenes: normal                 Vagina: normal appearing vagina with normal color and discharge, no lesions              Cervix: no lesions              Pap taken: No. Bimanual Exam:  Uterus:  normal size, contour, position, consistency, mobility, non-tender              Adnexa: normal adnexa and no mass, fullness, tenderness                Rectovaginal: Confirms               Anus:  normal sphincter tone, no lesions  Procedure:  Speculum reinserted.  Cervix visualized and cleansed with Betadine x 3.  Paracervical block was not placed.  Single toothed tenaculum applied to anterior lip of cervix.  IUD string could not be visualized.  Cervix dilated with milex dilator.  Curette passed and rotated in clockwise fashion.  This was withdrawn from the endometrial cavity with the IUD attached.  Uterus sounded to 9cm.  Lot number: [id number].  Expiration:  05/2021.  IUD package was opened.  IUD and introducer passed to fundus and then withdrawn slightly before IUD was passed into endometrial cavity.  Introducer removed.  Strings cut to 2cm.  Tenaculum removed from cervix.  Minimal bleeding noted.  Pt tolerated the procedure well.  All instruments removed from vagina.  Chaperone was present for exam.  A:  Well Woman with normal exam Epilepsy OCD H/o anxiety IUD placed 04/17/14.  Removal and replacement today of new Mirena IUD.  P:   Mammogram guidelines reviewed.  She will start this after her next birthday.   pap smear not indicated.  Neg pap with neg HR HPV 7/18. CBC, CMP, Lipids, TSH and Vit D obtained today Mirena IUD removed and replaced today.  She is aware this is due for removal/replacement no later than 04/17/2024. Return annually or prn

## 2019-04-18 NOTE — Patient Instructions (Signed)

## 2019-04-19 LAB — COMPREHENSIVE METABOLIC PANEL
ALT: 10 IU/L (ref 0–32)
AST: 15 IU/L (ref 0–40)
Albumin/Globulin Ratio: 1.7 (ref 1.2–2.2)
Albumin: 4.5 g/dL (ref 3.8–4.8)
Alkaline Phosphatase: 42 IU/L (ref 39–117)
BUN/Creatinine Ratio: 16 (ref 9–23)
BUN: 11 mg/dL (ref 6–20)
Bilirubin Total: 0.2 mg/dL (ref 0.0–1.2)
CO2: 26 mmol/L (ref 20–29)
Calcium: 9.4 mg/dL (ref 8.7–10.2)
Chloride: 99 mmol/L (ref 96–106)
Creatinine, Ser: 0.69 mg/dL (ref 0.57–1.00)
GFR calc Af Amer: 127 mL/min/{1.73_m2} (ref >59)
GFR calc non Af Amer: 110 mL/min/{1.73_m2} (ref >59)
Globulin, Total: 2.6 g/dL (ref 1.5–4.5)
Glucose: 82 mg/dL (ref 65–99)
Potassium: 4.2 mmol/L (ref 3.5–5.2)
Sodium: 139 mmol/L (ref 134–144)
Total Protein: 7.1 g/dL (ref 6.0–8.5)

## 2019-04-19 LAB — CBC
Hematocrit: 41.3 % (ref 34.0–46.6)
Hemoglobin: 14 g/dL (ref 11.1–15.9)
MCH: 32.2 pg (ref 26.6–33.0)
MCHC: 33.9 g/dL (ref 31.5–35.7)
MCV: 95 fL (ref 79–97)
Platelets: 163 10*3/uL (ref 150–450)
RBC: 4.35 x10E6/uL (ref 3.77–5.28)
RDW: 12.4 % (ref 11.7–15.4)
WBC: 4.9 10*3/uL (ref 3.4–10.8)

## 2019-04-19 LAB — LIPID PANEL
Chol/HDL Ratio: 3 ratio (ref 0.0–4.4)
Cholesterol, Total: 161 mg/dL (ref 100–199)
HDL: 53 mg/dL (ref >39)
LDL Chol Calc (NIH): 82 mg/dL (ref 0–99)
Triglycerides: 152 mg/dL — ABNORMAL HIGH (ref 0–149)
VLDL Cholesterol Cal: 26 mg/dL (ref 5–40)

## 2019-04-19 LAB — VITAMIN D 25 HYDROXY (VIT D DEFICIENCY, FRACTURES): Vit D, 25-Hydroxy: 35.7 ng/mL (ref 30.0–100.0)

## 2019-04-19 LAB — TSH: TSH: 3.5 u[IU]/mL (ref 0.450–4.500)

## 2019-06-14 ENCOUNTER — Other Ambulatory Visit: Payer: Self-pay

## 2019-06-16 ENCOUNTER — Encounter: Payer: Self-pay | Admitting: Obstetrics & Gynecology

## 2019-06-16 ENCOUNTER — Other Ambulatory Visit: Payer: Self-pay

## 2019-06-16 ENCOUNTER — Ambulatory Visit: Payer: BC Managed Care – PPO | Admitting: Obstetrics & Gynecology

## 2019-06-16 VITALS — BP 108/72 | HR 76 | Temp 97.2°F | Resp 12 | Wt 134.0 lb

## 2019-06-16 DIAGNOSIS — N926 Irregular menstruation, unspecified: Secondary | ICD-10-CM | POA: Diagnosis not present

## 2019-06-16 DIAGNOSIS — Z30431 Encounter for routine checking of intrauterine contraceptive device: Secondary | ICD-10-CM

## 2019-06-16 NOTE — Progress Notes (Signed)
GYNECOLOGY  VISIT  CC:   IUD recheck   HPI: 39 y.o. G1P1 Significant Other White or Caucasian female here for IUD recheck.  Mirena removed and replaced 04/18/2019.  She is having a little bit of spotting.  Denies pelvic pain, vaginal discharge or painful intercourse.  Getting married 09/02/2019.  Happy about this.  Relationship with ex husband is very good.  Contraception: IUD Menopausal hormone therapy: n/a  Patient Active Problem List   Diagnosis Date Noted  . Anxiety 10/09/2014  . Convulsions, epileptic (HCC) 05/24/2014  . 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 Frequent UTI 05/24/2014    Past Medical History:  Diagnosis Date  . Anxiety    h/o anxiety/OCD  . Epilepsy [facility])     Past Surgical History:  Procedure Laterality Date  . CESAREAN SECTION     secondary FTP  . excision of wart     from hand, age 69  . mirena     IUD removal 2011, reinsertion 2013-5/15, reinsertion 04/2014  . WISDOM TOOTH EXTRACTION      MEDS:   Current Outpatient Medications on File Prior to Visit  Medication Sig Dispense Refill  . divalproex (DEPAKOTE) 500 MG DR tablet Take by mouth.    . levonorgestrel (MIRENA) 20 MCG/24HR IUD by Intrauterine route. Placed 04/17/2014     No current facility-administered medications on file prior to visit.    ALLERGIES: Lamictal [lamotrigine]  Family History  Problem Relation Age of Onset  . Skin cancer Father   . Skin cancer Mother   . Hypertension Mother   . Hypothyroidism Mother   . Asthma Mother   . Thyroid disease Mother   . Skin cancer Paternal Grandfather   . Osteoarthritis Paternal Grandfather   . Hypertension Maternal Grandmother   . Osteoporosis Maternal Grandmother   . Osteoarthritis Maternal Grandmother   . Breast cancer Maternal Aunt   . Seizures Maternal Aunt   . Stroke Maternal Grandfather   . Osteoarthritis Paternal Grandmother     SH:  Divorced but engaged, non smoker  Review of Systems  All other systems reviewed and are  negative.   PHYSICAL EXAMINATION:    BP 108/72 (BP Location: Left Arm, Patient Position: Sitting, Cuff Size: Normal)   Pulse 76   Temp (!) 97.2 F (36.2 C) (Temporal)   Resp 12   Wt 134 lb (60.8 kg)   LMP 06/05/2019   BMI 23.74 kg/m     General appearance: alert, cooperative and appears stated age Abdomen: soft, non-tender; bowel sounds normal; no masses,  no organomegaly Lymph:  no inguinal LAD noted  Pelvic: External genitalia:  no lesions              Urethra:  normal appearing urethra with no masses, tenderness or lesions              Bartholins and Skenes: normal                 Vagina: normal appearing vagina with normal color and discharge, no lesions              Cervix: no lesions and IUD string noted              Bimanual Exam:  Uterus:  normal size, contour, position, consistency, mobility, non-tender              Adnexa: no mass, fullness, tenderness  Chaperone, Zenovia Jordan, CMA, was present for exam.  Assessment: IUD check after Mirena removal and replacement on  04/18/2019  Plan: Pt aware that I expect her bleeding to completely stop as she did not bleed with her prior Mirena.  Aware it can take several months for this to stop completely.  If still bleeding in 3 months, she will call and let me know.

## 2019-07-20 ENCOUNTER — Encounter: Payer: Self-pay | Admitting: Obstetrics & Gynecology

## 2019-07-21 ENCOUNTER — Telehealth: Payer: Self-pay | Admitting: Obstetrics & Gynecology

## 2019-07-21 DIAGNOSIS — N926 Irregular menstruation, unspecified: Secondary | ICD-10-CM

## 2019-07-21 DIAGNOSIS — Z30431 Encounter for routine checking of intrauterine contraceptive device: Secondary | ICD-10-CM

## 2019-07-21 NOTE — Telephone Encounter (Signed)
Spoke with patient. Mirena IUD exchange 04/18/19, was seen for recheck 06/16/19. Patient reports light menses once a month, denies any other GYN symptoms or pain. LMP - started "1 wk ago", still on menses. Patient states she discussed with Dr. Hyacinth Meeker at [facility], expected she would not have a monthly menses by now, calling to follow up. Reviewed OV note, advised patient I will review plan of care with Dr. Hyacinth Meeker and f/u, patient agreeable.   Dr. Hyacinth Meeker -please advise on f/u.

## 2019-07-21 NOTE — Telephone Encounter (Signed)
If she is ok having a PUS, I think we should check placement of IUD.  Ok to schedule.  Thanks.

## 2019-07-21 NOTE — Telephone Encounter (Signed)
Spoke with patient, advised as seen below per Dr. Hyacinth Meeker, patient agreeable to proceed with PUS. PUS scheduled for 3/25 at 3pm, consult to follow at 3:30pm with Dr. Hyacinth Meeker. Order placed for precert. Patient verbalzies understanding and is agreeable.   Routing to [employer] and [employer].   Encounter closed.

## 2019-07-21 NOTE — Telephone Encounter (Signed)
Patient sent the following correspondence through MyChart.  Good morning,  Just wanted to let you know that I'm still having my period, even with the IUD. I know you said to let you know if it continued, so I thought I should check in. I started about a week ago and it hasn't been heavy, but it's there nonetheless. Let me know if you would like for me to come in for a check up.  Thanks! Madeline Figueroa

## 2019-07-26 ENCOUNTER — Telehealth: Payer: Self-pay | Admitting: Obstetrics & Gynecology

## 2019-07-26 NOTE — Telephone Encounter (Signed)
Call placed to convey benefits for ultrasound. Spoke with the patient and conveyed the benefits. Patient understand/agreeable with the benefits. Patient is aware of the cancellation policy. Appointment scheduled 08/03/19.

## 2019-08-03 ENCOUNTER — Encounter: Payer: Self-pay | Admitting: Obstetrics & Gynecology

## 2019-08-03 ENCOUNTER — Ambulatory Visit (INDEPENDENT_AMBULATORY_CARE_PROVIDER_SITE_OTHER): Payer: BC Managed Care – PPO

## 2019-08-03 ENCOUNTER — Other Ambulatory Visit: Payer: Self-pay

## 2019-08-03 ENCOUNTER — Ambulatory Visit: Payer: BC Managed Care – PPO | Admitting: Obstetrics & Gynecology

## 2019-08-03 VITALS — BP 110/68 | HR 72 | Temp 97.9°F | Ht 64.0 in | Wt 136.4 lb

## 2019-08-03 DIAGNOSIS — N926 Irregular menstruation, unspecified: Secondary | ICD-10-CM

## 2019-08-03 DIAGNOSIS — Z30431 Encounter for routine checking of intrauterine contraceptive device: Secondary | ICD-10-CM

## 2019-08-03 DIAGNOSIS — N83202 Unspecified ovarian cyst, left side: Secondary | ICD-10-CM

## 2019-08-03 DIAGNOSIS — K668 Other specified disorders of peritoneum: Secondary | ICD-10-CM | POA: Diagnosis not present

## 2019-08-03 NOTE — Progress Notes (Signed)
39 y.o. G1P1 Significant Other White or Caucasian female here for pelvic ultrasound due to assess IUD placement due to continued bleeding since IUD placement 04/2020.  She is getting married in April and so thrilled about this.    Patient's last menstrual period was 07/11/2019 (within days).  Contraception: IUD insertion  Findings:  UTERUS: 8.8 x 4.5 x 3.7cm with IUD in correct location EMS: 6.24mm ADNEXA: Left ovary:  2.1 x 1.7 x 1.4cm with 9 x 76mm collapsed corpus luteal cyst       Right ovary: 3.9 x 3.3 x 2.0cm with 3.4 x 2.7 x 3.1cm cyst CUL DE SAC: multiple peritoneal cysts noted measuring 3.4 x 3.1 x 2.8cm, 2.1 x 1.2cm, 2.7 x 3.3cm, 1.9 x 1.4cm, surrounding fluid was noted  Discussion:  Ultrasonographer supervised.  Images reviewed and findings discussed.  Pt aware IUD is in correct location.  Additional findings of peritoneal cyst and ovarian corpus luteal cyst, and right ovarian cyst noted.  Benign nature of all of the above findings discussed .  Assessment:  Irregular bleeding with current IUD placed in December Ovarian and peritoneal cysts  Plan:  Pt is going to let me know in 3 more months about bleeding, either way--continued or stopped.  She understands peritoneal cysts appear completely benign and not additional follow up needed for this. Questions answered.    After review of images and results, about 20 minutes spend in face to face discussion of additional follow and recommendations depending on how much irregular bleeding continues.

## 2019-10-31 ENCOUNTER — Encounter: Payer: Self-pay | Admitting: Obstetrics & Gynecology

## 2019-11-01 ENCOUNTER — Telehealth: Payer: Self-pay

## 2019-11-01 DIAGNOSIS — K668 Other specified disorders of peritoneum: Secondary | ICD-10-CM

## 2019-11-01 NOTE — Telephone Encounter (Signed)
[name] Hi Dr. Hyacinth Meeker,   I hope you're doing well. Just wanted to touch base with you because I wasn't sure if I needed to come in after the ultrasound I had back in March. I know that a cyst was found. Can you refresh my memory? I'm not in any pain and my periods seem to have disappeared for the moment, so I wasn't sure what was best. Just let me know.   Thanks so much,  Madeline Figueroa

## 2019-11-01 NOTE — Telephone Encounter (Signed)
Spoke with pt. Pt wanting to know if needs follow up PUS since last PUS in 08/03/19. Pt states has stopped bleeding completely since May 2021 and feeling good. Reviewed OV notes from 08/03/19 and gave pt recommendations. Pt states will call back if any bleeding occurs to give Dr Hyacinth Meeker. Pt advised will review with Dr Hyacinth Meeker and return call if any additional recommendations.  Pt verbalized understanding.   Routing to Dr Hyacinth Meeker Encounter closed.   Plan:  Pt is going to let me know in 3 more months about bleeding, either way--continued or stopped.  She understands peritoneal cysts appear completely benign and not additional follow up needed for this. Questions answered.

## 2019-11-01 NOTE — Telephone Encounter (Signed)
Due to peritoneal cysts on ultrasound, should repeat PUS.  I'm glad she's not bleeding now.  Ovarian cyst had completely benign appearance but will ensure this has resolved as well.  Please proceed with scheduling.  Thanks.

## 2019-11-02 NOTE — Addendum Note (Signed)
Addended by: Isabell Jarvis on: 11/02/2019 09:21 AM   Modules accepted: Orders

## 2019-11-02 NOTE — Telephone Encounter (Signed)
Spoke with pt. Pt given update and recommendations per Dr Hyacinth Meeker. Pt agreeable to repeat PUS.  Pt scheduled for PUS on 11/16/19 at 1230 pm with OV consult to follow with Dr Hyacinth Meeker. Pt verbalized understanding of date and time of appt. Pt aware of call for benefits with precert.   Routing to Dr Hyacinth Meeker for review.  Encounter closed.  Cc: Hayley for precert Order placed.

## 2019-11-16 ENCOUNTER — Ambulatory Visit: Payer: BC Managed Care – PPO | Admitting: Obstetrics & Gynecology

## 2019-11-16 ENCOUNTER — Other Ambulatory Visit: Payer: Self-pay

## 2019-11-16 ENCOUNTER — Encounter: Payer: Self-pay | Admitting: Obstetrics & Gynecology

## 2019-11-16 ENCOUNTER — Ambulatory Visit (INDEPENDENT_AMBULATORY_CARE_PROVIDER_SITE_OTHER): Payer: BC Managed Care – PPO

## 2019-11-16 VITALS — BP 104/64 | HR 68 | Resp 16 | Wt 141.0 lb

## 2019-11-16 DIAGNOSIS — N83202 Unspecified ovarian cyst, left side: Secondary | ICD-10-CM | POA: Diagnosis not present

## 2019-11-16 DIAGNOSIS — K668 Other specified disorders of peritoneum: Secondary | ICD-10-CM | POA: Diagnosis not present

## 2019-11-16 DIAGNOSIS — R3915 Urgency of urination: Secondary | ICD-10-CM

## 2019-11-16 DIAGNOSIS — Z30431 Encounter for routine checking of intrauterine contraceptive device: Secondary | ICD-10-CM

## 2019-11-16 MED ORDER — MIRABEGRON ER 25 MG PO TB24: 25.0000 mg | ORAL_TABLET | Freq: Every day | ORAL | 1 refills | Status: DC

## 2019-11-16 NOTE — Progress Notes (Signed)
39 y.o. G1P1 Married White or Caucasian female here for pelvic ultrasound to recheck ovarian cyst noted in March as well as recheck peritoneal cysts noted.    She reports she's been experiencing more issues with urinary urgency.  She does leak urine at times as well.  Gets up every night, sometimes more than once.  Denies any dysuria.  Does not feel like infection.  OAB treatment discussed to start.  She is not sure she wants to be on medication at this time.  Will check urine culture and if negative, consider medication.  Patient's last menstrual period was 10/19/2019 (exact date).  Contraception: Mirena IUD  Findings:  UTERUS: 9.1 x 4.4 x 3.5cm EMS:3.73mm, IUD in correct location ADNEXA: Left ovary: 4.4 x 3.4 x 2.1cm with 2.5 x 1.3 cm follicle       Right ovary: 4.2 x 2.8 x 3.0cm with 2.5 x 2.6cm follicle CUL DE SAC: small amt of free fluid noted, continued areas of fluid in med pelvis 32 x 68mm and 23 x 10mm around left adnexa  Discussion:  Images reviewed.  Cyst noted on prior ultrasound is resolved.  Fluid is noted in pelvis and around left ovary.  This is stable from prior images as well as peritoneal cysts.  IUD noted in correct location.  Do not feel repeat ultrasound is needed.  Assessment:  Ovarian cyst, resolved Pelvic and adnexal fluid, stable Peritoneal cysts IUD in correct location Urinary urgency and nocturia Urinary incontinence  Plan: Urine culture pending If this is negative, consider myrbetriq 25mg  for OAB treatment.  She is not sure she wants to start this but will call if she desires to start.  28 minutes spent in discussion with pt

## 2019-11-16 NOTE — Patient Instructions (Signed)
[email or web address] 

## 2019-11-17 ENCOUNTER — Encounter: Payer: Self-pay | Admitting: Obstetrics & Gynecology

## 2019-11-18 LAB — URINE CULTURE

## 2019-11-19 DIAGNOSIS — R3915 Urgency of urination: Secondary | ICD-10-CM | POA: Insufficient documentation

## 2020-06-21 ENCOUNTER — Encounter (HOSPITAL_BASED_OUTPATIENT_CLINIC_OR_DEPARTMENT_OTHER): Payer: Self-pay

## 2020-07-04 ENCOUNTER — Ambulatory Visit: Payer: BC Managed Care – PPO

## 2020-07-18 ENCOUNTER — Ambulatory Visit (INDEPENDENT_AMBULATORY_CARE_PROVIDER_SITE_OTHER): Payer: BC Managed Care – PPO | Admitting: Obstetrics & Gynecology

## 2020-07-18 ENCOUNTER — Other Ambulatory Visit (HOSPITAL_BASED_OUTPATIENT_CLINIC_OR_DEPARTMENT_OTHER): Payer: Self-pay | Admitting: Obstetrics & Gynecology

## 2020-07-18 ENCOUNTER — Other Ambulatory Visit: Payer: Self-pay

## 2020-07-18 ENCOUNTER — Other Ambulatory Visit (HOSPITAL_COMMUNITY)
Admission: RE | Admit: 2020-07-18 | Discharge: 2020-07-18 | Disposition: A | Discharge status: Home / Self Care | Payer: BC Managed Care – PPO | Source: Ambulatory Visit | Attending: Obstetrics & Gynecology | Admitting: Obstetrics & Gynecology

## 2020-07-18 ENCOUNTER — Ambulatory Visit (HOSPITAL_BASED_OUTPATIENT_CLINIC_OR_DEPARTMENT_OTHER)
Admission: RE | Admit: 2020-07-18 | Discharge: 2020-07-18 | Disposition: A | Discharge status: Home / Self Care | Payer: BC Managed Care – PPO | Source: Ambulatory Visit | Attending: Obstetrics & Gynecology | Admitting: Obstetrics & Gynecology

## 2020-07-18 ENCOUNTER — Encounter (HOSPITAL_BASED_OUTPATIENT_CLINIC_OR_DEPARTMENT_OTHER): Payer: Self-pay | Admitting: Obstetrics & Gynecology

## 2020-07-18 VITALS — BP 102/78 | HR 69 | Ht 63.25 in | Wt 137.6 lb

## 2020-07-18 DIAGNOSIS — Z124 Encounter for screening for malignant neoplasm of cervix: Secondary | ICD-10-CM | POA: Insufficient documentation

## 2020-07-18 DIAGNOSIS — Z1231 Encounter for screening mammogram for malignant neoplasm of breast: Secondary | ICD-10-CM

## 2020-07-18 DIAGNOSIS — Z1151 Encounter for screening for human papillomavirus (HPV): Secondary | ICD-10-CM | POA: Diagnosis not present

## 2020-07-18 DIAGNOSIS — Z01419 Encounter for gynecological examination (general) (routine) without abnormal findings: Secondary | ICD-10-CM

## 2020-07-18 DIAGNOSIS — G40909 Epilepsy, unspecified, not intractable, without status epilepticus: Secondary | ICD-10-CM

## 2020-07-18 DIAGNOSIS — R350 Frequency of micturition: Secondary | ICD-10-CM

## 2020-07-18 LAB — POCT URINALYSIS DIPSTICK
Appearance: NORMAL
Bilirubin, UA: NEGATIVE
Glucose, UA: NEGATIVE
Ketones, UA: NEGATIVE
Leukocytes, UA: NEGATIVE
Nitrite, UA: NEGATIVE
Protein, UA: NEGATIVE
Spec Grav, UA: 1.01 (ref 1.010–1.025)
Urobilinogen, UA: 0.2 E.U./dL
pH, UA: 6 (ref 5.0–8.0)

## 2020-07-18 NOTE — Patient Instructions (Signed)
oxytrol patches for women

## 2020-07-18 NOTE — Progress Notes (Signed)
40 y.o. G1P1 Married White or Caucasian female here for annual exam.  Flow is regular.  Had Mirena replaced 04/2019.  Does cycles.  Flow is 5 days and very light.  This is different from prior Mirena IUD where she had amenorrhea.  Still having urgency but decreasing V8 juice really helped.  Now, urgency is mostly with menstrual cycle.  Pt and I have discussed urinary urgency.  Referral discussed.  Declined.  Medications discussed.  OTC Oxytrol patches discussed.  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  Will start in the fall.  Patient's last menstrual period was 07/16/2020.          Sexually active: Yes.    The current method of family planning is IUD.    Exercising: Yes.    hiking Smoker:  no  Health Maintenance: Pap:  11/2016 History of abnormal Pap:  no MMG:  D/w pt starting today Colonoscopy:  Guidelines reviewed to start at age 59 Tdap: 07/16/2011 Screening Labs: not needed   reports that she has never smoked. She has never used smokeless tobacco. She reports current alcohol use of about 5.0 standard drinks of alcohol per week. She reports that she does not use drugs.  Past Medical History:  Diagnosis Date  . Anxiety    h/o anxiety/OCD  . Epilepsy [facility])     Past Surgical History:  Procedure Laterality Date  . CESAREAN SECTION     secondary FTP  . excision of wart     from hand, age 6  . mirena     IUD removal 2011, reinsertion 2013-5/15, reinsertion 04/2014  . WISDOM TOOTH EXTRACTION      Current Outpatient Medications  Medication Sig Dispense Refill  . divalproex (DEPAKOTE) 500 MG DR tablet Take 1 tablet by mouth 2 (two) times daily.    Marland Kitchen levonorgestrel (MIRENA) 20 MCG/24HR IUD by Intrauterine route. Placed 04/17/2014    . mirabegron ER (MYRBETRIQ) 25 MG TB24 tablet Take 1 tablet (25 mg total) by mouth daily. One po qd (Patient not taking: Reported on 07/18/2020) 30 tablet 1   No current facility-administered medications for this visit.     Family History  Problem Relation Age of Onset  . Skin cancer Father   . Skin cancer Mother   . Hypertension Mother   . Hypothyroidism Mother   . Asthma Mother   . Thyroid disease Mother   . Skin cancer Paternal Grandfather   . Osteoarthritis Paternal Grandfather   . Hypertension Maternal Grandmother   . Osteoporosis Maternal Grandmother   . Osteoarthritis Maternal Grandmother   . Breast cancer Maternal Aunt   . Seizures Maternal Aunt   . Stroke Maternal Grandfather   . Osteoarthritis Paternal Grandmother     Review of Systems  Genitourinary: Positive for urgency.    Exam:   BP 102/78   Pulse 69   Ht 5' 3.25" (1.607 m)   Wt 137 lb 9.6 oz (62.4 kg)   LMP 07/16/2020   BMI 24.18 kg/m   Height: 5' 3.25" (160.7 cm)  General appearance: alert, cooperative and appears stated age Head: Normocephalic, without obvious abnormality, atraumatic Neck: no adenopathy, supple, symmetrical, trachea midline and thyroid normal to inspection and palpation Lungs: clear to auscultation bilaterally Breasts: normal appearance, no masses or tenderness Heart: regular rate and rhythm Abdomen: soft, non-tender; bowel sounds normal; no masses,  no organomegaly Extremities: extremities normal, atraumatic, no cyanosis or edema Skin: Skin color, texture, turgor normal. No rashes or  lesions Lymph nodes: Cervical, supraclavicular, and axillary nodes normal. No abnormal inguinal nodes palpated Neurologic: Grossly normal   Pelvic: External genitalia:  no lesions              Urethra:  normal appearing urethra with no masses, tenderness or lesions              Bartholins and Skenes: normal                 Vagina: normal appearing vagina with normal color and discharge, no lesions              Cervix: no lesions              Pap taken: Yes.   Bimanual Exam:  Uterus:  normal size, contour, position, consistency, mobility, non-tender              Adnexa: normal adnexa and no mass, fullness,  tenderness               Rectovaginal: Confirms               Anus:  normal sphincter tone, no lesions  Chaperone, Francene Finders, CMA, was present for exam.  Assessment/Plan: 1. Well woman exam with routine gynecological exam - pap and HR HPV obtained today - will try to have pt do MMG today - colonoscopy guidelines reviewed - vaccines reviewed  2. Urinary frequency (Probable IC) - treatment options discussed.  Declines urology referral.  Medications discussed.  She is going to try OTC oxytrol patches just with menstrual cycle - POCT urinalysis dipstick  3. Nonintractable epilepsy without status epilepticus, unspecified epilepsy type (HCC)  4.  IUD in place

## 2020-07-22 ENCOUNTER — Encounter (HOSPITAL_BASED_OUTPATIENT_CLINIC_OR_DEPARTMENT_OTHER): Payer: Self-pay

## 2020-07-22 LAB — CYTOLOGY - PAP
Comment: NEGATIVE
Diagnosis: NEGATIVE
High risk HPV: NEGATIVE

## 2020-07-24 ENCOUNTER — Other Ambulatory Visit: Payer: Self-pay | Admitting: Obstetrics & Gynecology

## 2020-07-24 DIAGNOSIS — R928 Other abnormal and inconclusive findings on diagnostic imaging of breast: Secondary | ICD-10-CM

## 2020-08-12 ENCOUNTER — Other Ambulatory Visit: Payer: Self-pay | Admitting: Obstetrics & Gynecology

## 2020-08-12 ENCOUNTER — Other Ambulatory Visit: Payer: Self-pay

## 2020-08-12 ENCOUNTER — Ambulatory Visit
Admission: RE | Admit: 2020-08-12 | Discharge: 2020-08-12 | Disposition: A | Discharge status: Home / Self Care | Payer: BC Managed Care – PPO | Source: Ambulatory Visit | Attending: Obstetrics & Gynecology | Admitting: Obstetrics & Gynecology

## 2020-08-12 DIAGNOSIS — R921 Mammographic calcification found on diagnostic imaging of breast: Secondary | ICD-10-CM

## 2020-08-12 DIAGNOSIS — R928 Other abnormal and inconclusive findings on diagnostic imaging of breast: Secondary | ICD-10-CM

## 2020-08-16 ENCOUNTER — Other Ambulatory Visit: Payer: Self-pay

## 2020-08-16 ENCOUNTER — Ambulatory Visit
Admission: RE | Admit: 2020-08-16 | Discharge: 2020-08-16 | Disposition: A | Discharge status: Home / Self Care | Payer: BC Managed Care – PPO | Source: Ambulatory Visit | Attending: Obstetrics & Gynecology | Admitting: Obstetrics & Gynecology

## 2020-08-16 DIAGNOSIS — R921 Mammographic calcification found on diagnostic imaging of breast: Secondary | ICD-10-CM

## 2020-08-16 HISTORY — PX: BREAST BIOPSY: SHX20

## 2020-08-21 ENCOUNTER — Other Ambulatory Visit: Payer: Self-pay | Admitting: Obstetrics & Gynecology

## 2020-08-21 DIAGNOSIS — R928 Other abnormal and inconclusive findings on diagnostic imaging of breast: Secondary | ICD-10-CM

## 2020-08-27 ENCOUNTER — Encounter (HOSPITAL_BASED_OUTPATIENT_CLINIC_OR_DEPARTMENT_OTHER): Payer: Self-pay

## 2020-09-09 NOTE — Telephone Encounter (Signed)
I've spoke with Mrs. Sandback and we decided to do an Endosee in the office to see if she has a polyp or any other endometrial lesion that might cause irregular bleeding.  If it does not show any abnormal finding, she wants this IUD removed and a new one placed.  Will discuss more when I'm back in the office.

## 2021-01-28 ENCOUNTER — Telehealth (HOSPITAL_BASED_OUTPATIENT_CLINIC_OR_DEPARTMENT_OTHER): Payer: Self-pay

## 2021-01-28 NOTE — Telephone Encounter (Signed)
Patient called office 01/27/2021 with medication concerns. I called and LMOM at 2:32 for patient to give our office a call back. tbw

## 2021-01-30 ENCOUNTER — Encounter (HOSPITAL_BASED_OUTPATIENT_CLINIC_OR_DEPARTMENT_OTHER): Payer: Self-pay | Admitting: Obstetrics & Gynecology

## 2021-01-30 ENCOUNTER — Other Ambulatory Visit: Payer: Self-pay

## 2021-01-30 ENCOUNTER — Ambulatory Visit (HOSPITAL_BASED_OUTPATIENT_CLINIC_OR_DEPARTMENT_OTHER): Payer: BC Managed Care – PPO | Admitting: Obstetrics & Gynecology

## 2021-01-30 VITALS — BP 119/89 | HR 71 | Ht 63.0 in | Wt 143.8 lb

## 2021-01-30 DIAGNOSIS — N764 Abscess of vulva: Secondary | ICD-10-CM | POA: Diagnosis not present

## 2021-01-30 MED ORDER — SULFAMETHOXAZOLE-TRIMETHOPRIM 800-160 MG PO TABS: 1.0000 | ORAL_TABLET | Freq: Two times a day (BID) | ORAL | 0 refills | Status: DC

## 2021-01-30 NOTE — Progress Notes (Signed)
GYNECOLOGY  VISIT  CC:  pain vulvar bump  HPI: 40 y.o. G1P1 Married White or Caucasian female here for complaint of vaginal bump that is tender and has been present for two weeks.  No drainage.  No blood.  Hasn't really done anything new recently.  Still continues to have some irregular intermenstrual spotting with IUD.  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     levonorgestrel (MIRENA) 20 MCG/24HR IUD by Intrauterine route. Placed 04/17/2014     No current facility-administered medications on file prior to visit.    ALLERGIES: Lamictal [lamotrigine]  Family History  Problem Relation Age of Onset   Skin cancer Father    Skin cancer Mother    Hypertension Mother    Hypothyroidism Mother    Asthma Mother    Thyroid disease Mother    Skin cancer Paternal Grandfather    Osteoarthritis Paternal Grandfather    Hypertension Maternal Grandmother    Osteoporosis Maternal Grandmother    Osteoarthritis Maternal Grandmother    Breast cancer Maternal Aunt    Seizures Maternal Aunt    Stroke Maternal Grandfather    Osteoarthritis Paternal Grandmother     SH:  married, non smoker  Review of Systems  Constitutional: Negative.   Genitourinary:        Vulvar lesion, vulvar pain   PHYSICAL EXAMINATION:    BP 119/89 (BP Location: Right Arm, Patient  Position: Sitting, Cuff Size: Small)   Pulse 71   Ht 5\' 3"  (1.6 m)   Wt 143 lb 12.8 oz (65.2 kg)   LMP 01/30/2021   BMI 25.47 kg/m     General appearance: alert, cooperative and appears stated age Lymph:  no inguinal LAD noted  Pelvic: External genitalia:  tender, slightly raised lesion on inner left labia majora, with some compression purulent material drained              Urethra:  normal appearing urethra with no masses, tenderness or lesions              Bartholins and Skenes: normal                  Chaperone, 02/01/2021, CMA, was present for exam.  Assessment/Plan: 1. Labial abscess - sulfamethoxazole-trimethoprim (BACTRIM DS) 800-160 MG tablet; Take 1 tablet by mouth 2 (two) times daily.  Dispense: 14 tablet; Refill: 0 - WOUND CULTURE

## 2021-02-04 NOTE — Telephone Encounter (Signed)
Patient came in 01/30/2001 as was seen. tbw

## 2021-02-09 ENCOUNTER — Encounter (HOSPITAL_BASED_OUTPATIENT_CLINIC_OR_DEPARTMENT_OTHER): Payer: Self-pay

## 2021-02-09 LAB — WOUND CULTURE

## 2021-02-21 ENCOUNTER — Other Ambulatory Visit: Payer: Self-pay

## 2021-02-21 ENCOUNTER — Ambulatory Visit
Admission: RE | Admit: 2021-02-21 | Discharge: 2021-02-21 | Disposition: A | Discharge status: Home / Self Care | Payer: BC Managed Care – PPO | Source: Ambulatory Visit | Attending: Obstetrics & Gynecology | Admitting: Obstetrics & Gynecology

## 2021-02-21 DIAGNOSIS — R928 Other abnormal and inconclusive findings on diagnostic imaging of breast: Secondary | ICD-10-CM

## 2021-02-26 ENCOUNTER — Encounter: Payer: Self-pay | Admitting: *Deleted

## 2021-06-17 ENCOUNTER — Other Ambulatory Visit: Payer: Self-pay | Admitting: Obstetrics & Gynecology

## 2021-06-17 DIAGNOSIS — Z1231 Encounter for screening mammogram for malignant neoplasm of breast: Secondary | ICD-10-CM

## 2021-08-01 ENCOUNTER — Ambulatory Visit (INDEPENDENT_AMBULATORY_CARE_PROVIDER_SITE_OTHER): Payer: BC Managed Care – PPO | Admitting: Obstetrics & Gynecology

## 2021-08-01 ENCOUNTER — Encounter (HOSPITAL_BASED_OUTPATIENT_CLINIC_OR_DEPARTMENT_OTHER): Payer: Self-pay | Admitting: Obstetrics & Gynecology

## 2021-08-01 ENCOUNTER — Other Ambulatory Visit: Payer: Self-pay

## 2021-08-01 ENCOUNTER — Ambulatory Visit
Admission: RE | Admit: 2021-08-01 | Discharge: 2021-08-01 | Disposition: A | Discharge status: Home / Self Care | Payer: BC Managed Care – PPO | Source: Ambulatory Visit | Attending: Obstetrics & Gynecology | Admitting: Obstetrics & Gynecology

## 2021-08-01 VITALS — BP 112/78 | HR 70 | Ht 63.25 in | Wt 140.2 lb

## 2021-08-01 DIAGNOSIS — Z01419 Encounter for gynecological examination (general) (routine) without abnormal findings: Secondary | ICD-10-CM | POA: Diagnosis not present

## 2021-08-01 DIAGNOSIS — Z975 Presence of (intrauterine) contraceptive device: Secondary | ICD-10-CM

## 2021-08-01 DIAGNOSIS — Z23 Encounter for immunization: Secondary | ICD-10-CM

## 2021-08-01 DIAGNOSIS — N764 Abscess of vulva: Secondary | ICD-10-CM | POA: Diagnosis not present

## 2021-08-01 DIAGNOSIS — G40909 Epilepsy, unspecified, not intractable, without status epilepticus: Secondary | ICD-10-CM

## 2021-08-01 DIAGNOSIS — Z1231 Encounter for screening mammogram for malignant neoplasm of breast: Secondary | ICD-10-CM

## 2021-08-01 MED ORDER — SULFAMETHOXAZOLE-TRIMETHOPRIM 800-160 MG PO TABS
1.0000 | ORAL_TABLET | Freq: Two times a day (BID) | ORAL | 0 refills | Status: DC
Start: 2021-08-01 — End: 2022-07-07

## 2021-08-01 NOTE — Progress Notes (Signed)
41 y.o. G1P1 Married White or Caucasian female here for annual exam.  Doing well.  Having some recurrent issues with small labial abscesses.  Think she has one now.   ? ?Had Covid in February.   ? ?Is SA.  Uses IUD for contraception.  Does not desire other children.  Doesn't really have cycles with her IUD.  Really not having spotting either now. ? ?IUD placed 04/2019.   ? ? Upstream - 08/01/21 0819   ? ?  ? Pregnancy Intention Screening  ? Does the patient want to become pregnant in the next year? No   ? Does the patient's partner want to become pregnant in the next year? No   ? Would the patient like to discuss contraceptive options today? No   ?  ? Contraception Wrap Up  ? Current Method IUD or IUS   ? ?  ?  ? ?  ? ? ?Health Maintenance: ?Pap:  07/18/20 neg, neg HR HPV ?History of abnormal Pap:  no ?MMG:  scheduled today ?Colonoscopy:  screening discussed ?Screening Labs: 05/19/2021 ? ? reports that she has never smoked. She has never used smokeless tobacco. She reports current alcohol use of about 5.0 standard drinks per week. She reports that she does not use drugs. ? ?Past Medical History:  ?Diagnosis Date  ? Anxiety   ? h/o anxiety/OCD  ? Epilepsy (HCC)   ? ? ?Past Surgical History:  ?Procedure Laterality Date  ? BREAST BIOPSY Left 08/16/2020  ? CESAREAN SECTION    ? secondary FTP  ? excision of wart    ? from hand, age 57  ? mirena    ? IUD removal 2011, reinsertion 2013-5/15, reinsertion 04/2014  ? WISDOM TOOTH EXTRACTION    ? ? ?Current Outpatient Medications  ?Medication Sig Dispense Refill  ? divalproex (DEPAKOTE) 500 MG DR tablet Take 1 tablet by mouth 2 (two) times daily.    ? levonorgestrel (MIRENA) 20 MCG/24HR IUD by Intrauterine route. Placed 04/17/2014    ? ?No current facility-administered medications for this visit.  ? ? ?Family History  ?Problem Relation Age of Onset  ? Skin cancer Paternal Grandfather   ? Osteoarthritis Paternal Grandfather   ? Osteoarthritis Paternal Grandmother   ? Hypertension  Maternal Grandmother   ? Osteoporosis Maternal Grandmother   ? Osteoarthritis Maternal Grandmother   ? Stroke Maternal Grandfather   ? Skin cancer Father   ? Skin cancer Mother   ? Hypertension Mother   ? Hypothyroidism Mother   ? Asthma Mother   ? Thyroid disease Mother   ? Breast cancer Maternal Aunt   ? Seizures Maternal Aunt   ? ? ?Review of Systems  ?All other systems reviewed and are negative. ? ?Exam:   ?Ht 5' 3.25" (1.607 m)   Wt 140 lb 3.2 oz (63.6 kg)   BMI 24.64 kg/m?   Height: 5' 3.25" (160.7 cm) ? ?General appearance: alert, cooperative and appears stated age ?Head: Normocephalic, without obvious abnormality, atraumatic ?Neck: no adenopathy, supple, symmetrical, trachea midline and thyroid normal to inspection and palpation ?Lungs: clear to auscultation bilaterally ?Breasts: normal appearance, no masses or tenderness ?Heart: regular rate and rhythm ?Abdomen: soft, non-tender; bowel sounds normal; no masses,  no organomegaly ?Extremities: extremities normal, atraumatic, no cyanosis or edema ?Skin: Skin color, texture, turgor normal. No rashes or lesions ?Lymph nodes: Cervical, supraclavicular, and axillary nodes normal. ?No abnormal inguinal nodes palpated ?Neurologic: Grossly normal ? ? ?Pelvic: External genitalia:  no lesions ?  Urethra:  normal appearing urethra with no masses, tenderness or lesions ?             Bartholins and Skenes: normal    ?             Vagina: normal appearing vagina with normal color and no discharge, no lesions ?             Cervix: no lesions ?             Pap taken: No. ?Bimanual Exam:  Uterus:  normal size, contour, position, consistency, mobility, non-tender ?             Adnexa: normal adnexa ?              Rectovaginal: Confirms ?              Anus:  normal sphincter tone, no lesions ? ?Chaperone, Ina Homes, CMA, was present for exam. ? ?Assessment/Plan: ?1. Well woman exam with routine gynecological exam ?- pap neg 2022.  Not indicated today. ?- MMG  scheduled today ?- colonoscopy screening guidelines reviewed ?- tdap updated today ? ?2. Nonintractable epilepsy without status epilepticus, unspecified epilepsy type (HCC) ? ?3. IUD (intrauterine device) in place ?- 8 year indication discussed ? ?4. Labial abscess ?- sulfamethoxazole-trimethoprim (BACTRIM DS) 800-160 MG tablet; Take 1 tablet by mouth 2 (two) times daily.  Dispense: 10 tablet; Refill: 0 ? ?

## 2021-08-04 ENCOUNTER — Other Ambulatory Visit: Payer: Self-pay | Admitting: Obstetrics & Gynecology

## 2021-08-04 DIAGNOSIS — R928 Other abnormal and inconclusive findings on diagnostic imaging of breast: Secondary | ICD-10-CM

## 2021-08-06 ENCOUNTER — Ambulatory Visit
Admission: RE | Admit: 2021-08-06 | Discharge: 2021-08-06 | Disposition: A | Discharge status: Home / Self Care | Payer: BC Managed Care – PPO | Source: Ambulatory Visit | Attending: Obstetrics & Gynecology | Admitting: Obstetrics & Gynecology

## 2021-08-06 ENCOUNTER — Ambulatory Visit: Admission: RE | Admit: 2021-08-06 | Payer: BC Managed Care – PPO | Source: Ambulatory Visit

## 2021-08-06 DIAGNOSIS — R928 Other abnormal and inconclusive findings on diagnostic imaging of breast: Secondary | ICD-10-CM

## 2021-08-19 ENCOUNTER — Other Ambulatory Visit: Payer: BC Managed Care – PPO

## 2022-07-02 ENCOUNTER — Other Ambulatory Visit: Payer: Self-pay | Admitting: Obstetrics & Gynecology

## 2022-07-02 DIAGNOSIS — Z1231 Encounter for screening mammogram for malignant neoplasm of breast: Secondary | ICD-10-CM

## 2022-07-05 ENCOUNTER — Encounter (HOSPITAL_BASED_OUTPATIENT_CLINIC_OR_DEPARTMENT_OTHER): Payer: Self-pay | Admitting: Obstetrics & Gynecology

## 2022-07-07 ENCOUNTER — Encounter (HOSPITAL_BASED_OUTPATIENT_CLINIC_OR_DEPARTMENT_OTHER): Payer: Self-pay | Admitting: Obstetrics & Gynecology

## 2022-07-07 ENCOUNTER — Ambulatory Visit (HOSPITAL_BASED_OUTPATIENT_CLINIC_OR_DEPARTMENT_OTHER): Payer: BC Managed Care – PPO | Admitting: Obstetrics & Gynecology

## 2022-07-07 VITALS — BP 117/88 | HR 69 | Ht 63.5 in | Wt 145.0 lb

## 2022-07-07 DIAGNOSIS — N764 Abscess of vulva: Secondary | ICD-10-CM

## 2022-07-07 MED ORDER — SULFAMETHOXAZOLE-TRIMETHOPRIM 800-160 MG PO TABS: 1.0000 | ORAL_TABLET | Freq: Two times a day (BID) | ORAL | 1 refills | Status: DC

## 2022-07-07 NOTE — Progress Notes (Unsigned)
GYNECOLOGY  VISIT  CC:   tender labial cyst  HPI: 42 y.o. G1P1 Married White or Caucasian female here for complaint of tender labial cyst/infection that has been present a few days.  She had something like this a couple of years ago, in September 2022.  Needed to be opened and treated with antibiotics.  Area is tender today.  No fever.  No drainage.   Past Medical History:  Diagnosis Date   Anxiety    h/o anxiety/OCD   Epilepsy ([place])     MEDS:   Current Outpatient Medications on File Prior to Visit  Medication Sig Dispense Refill   divalproex (DEPAKOTE) 500 MG DR tablet Take 1 tablet by mouth 2 (two) times daily.     levonorgestrel (MIRENA) 20 MCG/24HR IUD by Intrauterine route. Placed 04/17/2014     No current facility-administered medications on file prior to visit.    ALLERGIES: Lamictal [lamotrigine]  SH:  married, non smoker  Review of Systems  Constitutional: Negative.   Genitourinary:        Tender labial cyst    PHYSICAL EXAMINATION:    BP 117/88 (BP Location: Left Arm, Patient Position: Sitting, Cuff Size: Normal)   Pulse 69   Ht 5' 3.5" (1.613 m) Comment: Reported  Wt 145 lb (65.8 kg)   BMI 25.28 kg/m     General appearance: alert, cooperative and appears stated age  Lymph:  no inguinal LAD noted  Pelvic: External genitalia:  small area on labia minor on left with small amount of purulent drainage, no surrounding erythema, tender to palpation              Urethra:  normal appearing urethra with no masses, tenderness or lesions              Bartholins and Skenes: normal                  Assessment/Plan: 1. Labial abscess - area is draining so I do not think this needs to be opened.  Will treat with warm compressed and antibiotics.  Pt to let me know if doesn't improve. - sulfamethoxazole-trimethoprim (BACTRIM DS) 800-160 MG tablet; Take 1 tablet by mouth 2 (two) times daily.  Dispense: 10 tablet; Refill: 1

## 2022-08-13 ENCOUNTER — Ambulatory Visit
Admission: RE | Admit: 2022-08-13 | Discharge: 2022-08-13 | Disposition: A | Discharge status: Home / Self Care | Payer: BC Managed Care – PPO | Source: Ambulatory Visit | Attending: Obstetrics & Gynecology | Admitting: Obstetrics & Gynecology

## 2022-08-13 DIAGNOSIS — Z1231 Encounter for screening mammogram for malignant neoplasm of breast: Secondary | ICD-10-CM

## 2022-10-26 ENCOUNTER — Ambulatory Visit (HOSPITAL_BASED_OUTPATIENT_CLINIC_OR_DEPARTMENT_OTHER): Payer: BC Managed Care – PPO | Admitting: Obstetrics & Gynecology

## 2022-12-17 ENCOUNTER — Ambulatory Visit (HOSPITAL_BASED_OUTPATIENT_CLINIC_OR_DEPARTMENT_OTHER): Payer: BC Managed Care – PPO | Admitting: Obstetrics & Gynecology

## 2023-01-24 IMAGING — MG MM DIGITAL DIAGNOSTIC UNILAT*L* W/ TOMO W/ CAD
4 series · 4 of 12 positions shown · non-contrast
Comparison: Previous exam(s).

CLINICAL DATA: The patient was called back for left breast
asymmetry.

EXAM:
DIGITAL DIAGNOSTIC UNILATERAL LEFT MAMMOGRAM WITH TOMOSYNTHESIS AND
CAD
TECHNIQUE: Left digital diagnostic mammography and breast tomosynthesis was
performed. The images were evaluated with computer-aided detection.

[L MLO synth-2D]
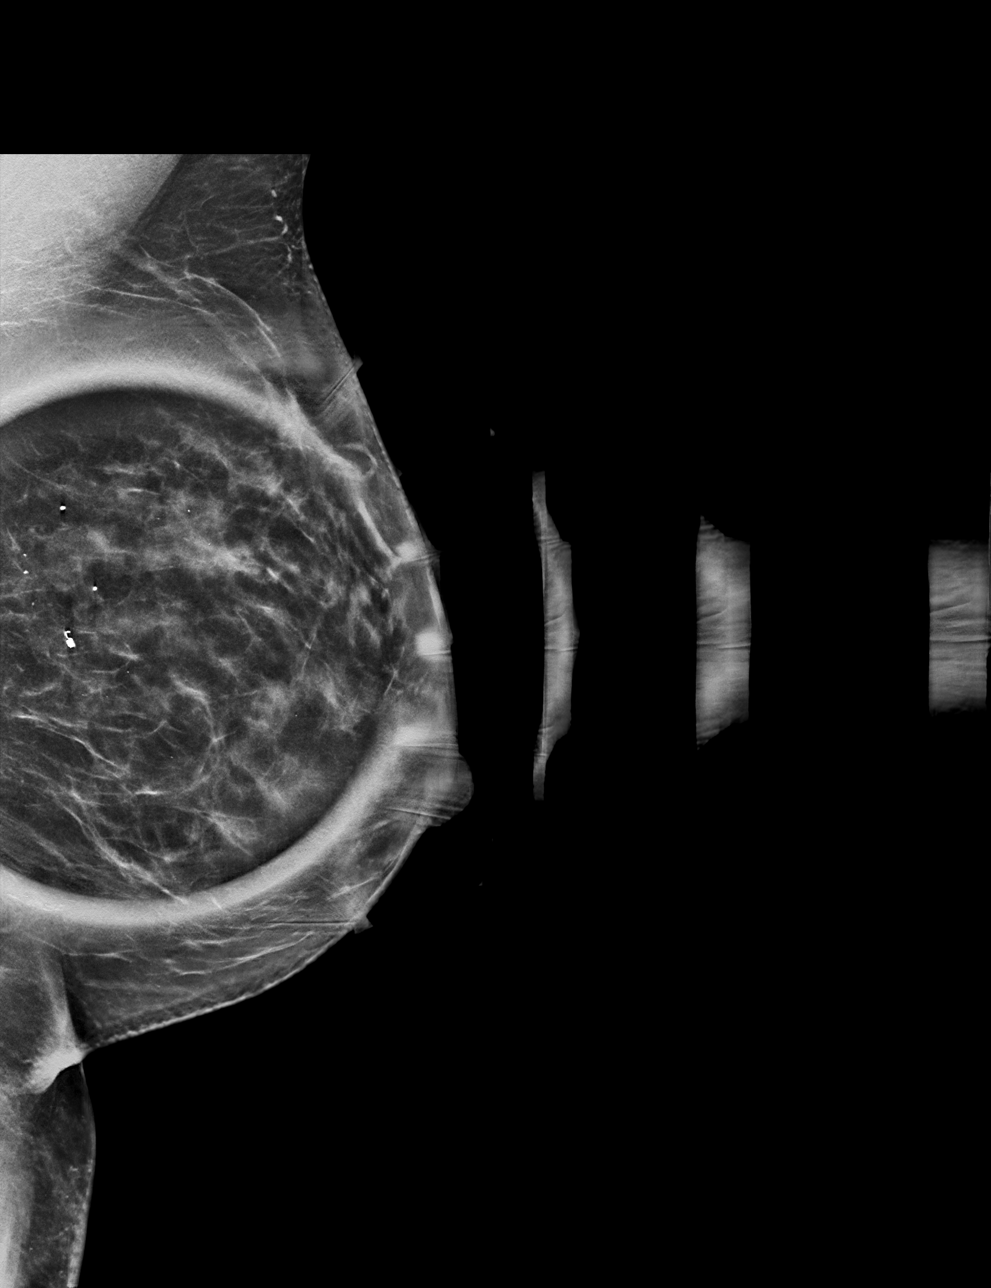

[L CC synth-2D]
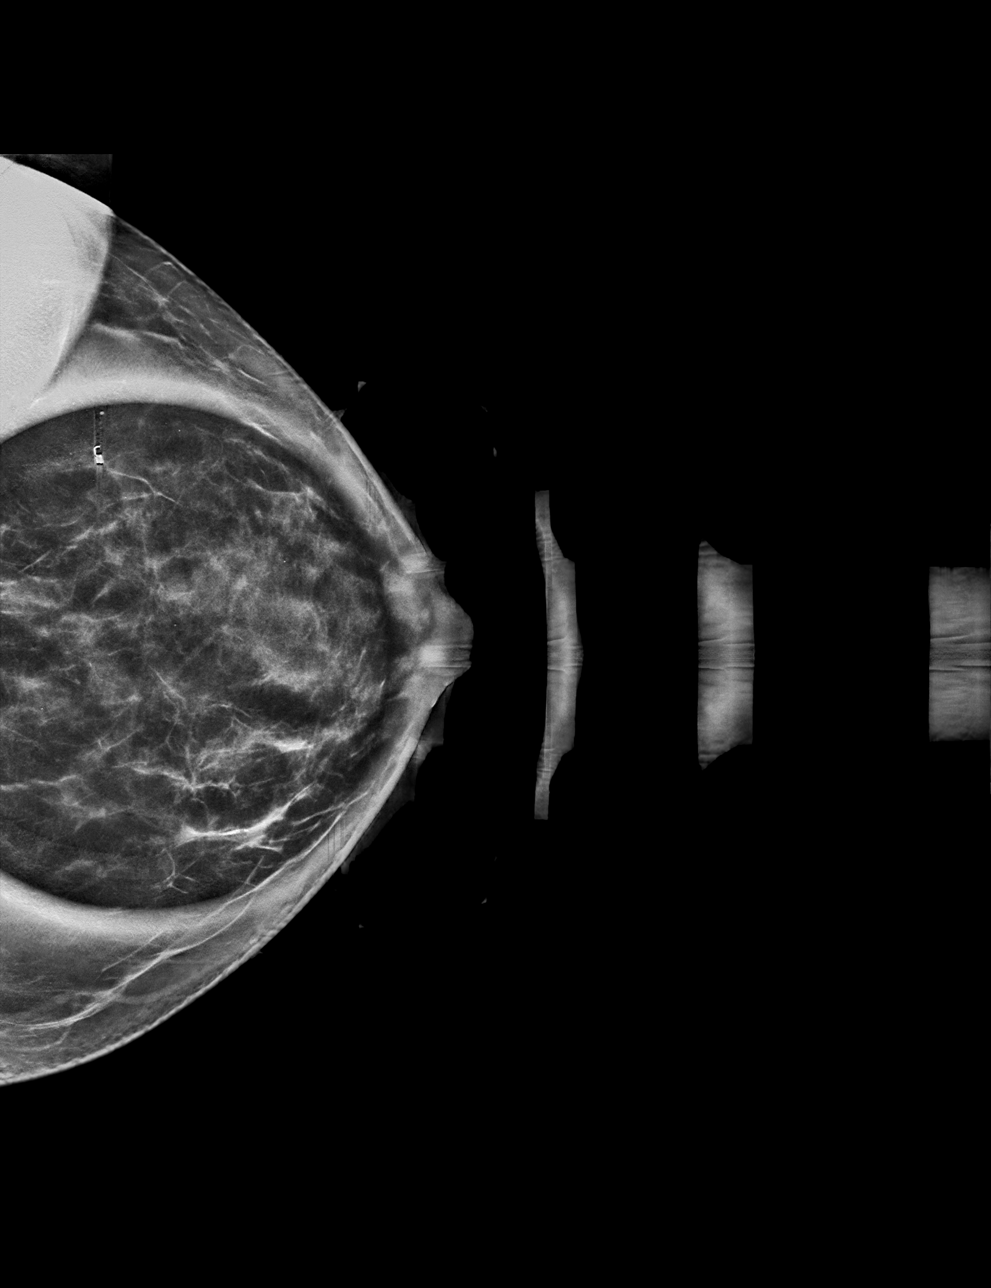

[L CC tomo · tomo slice 31/62.0]
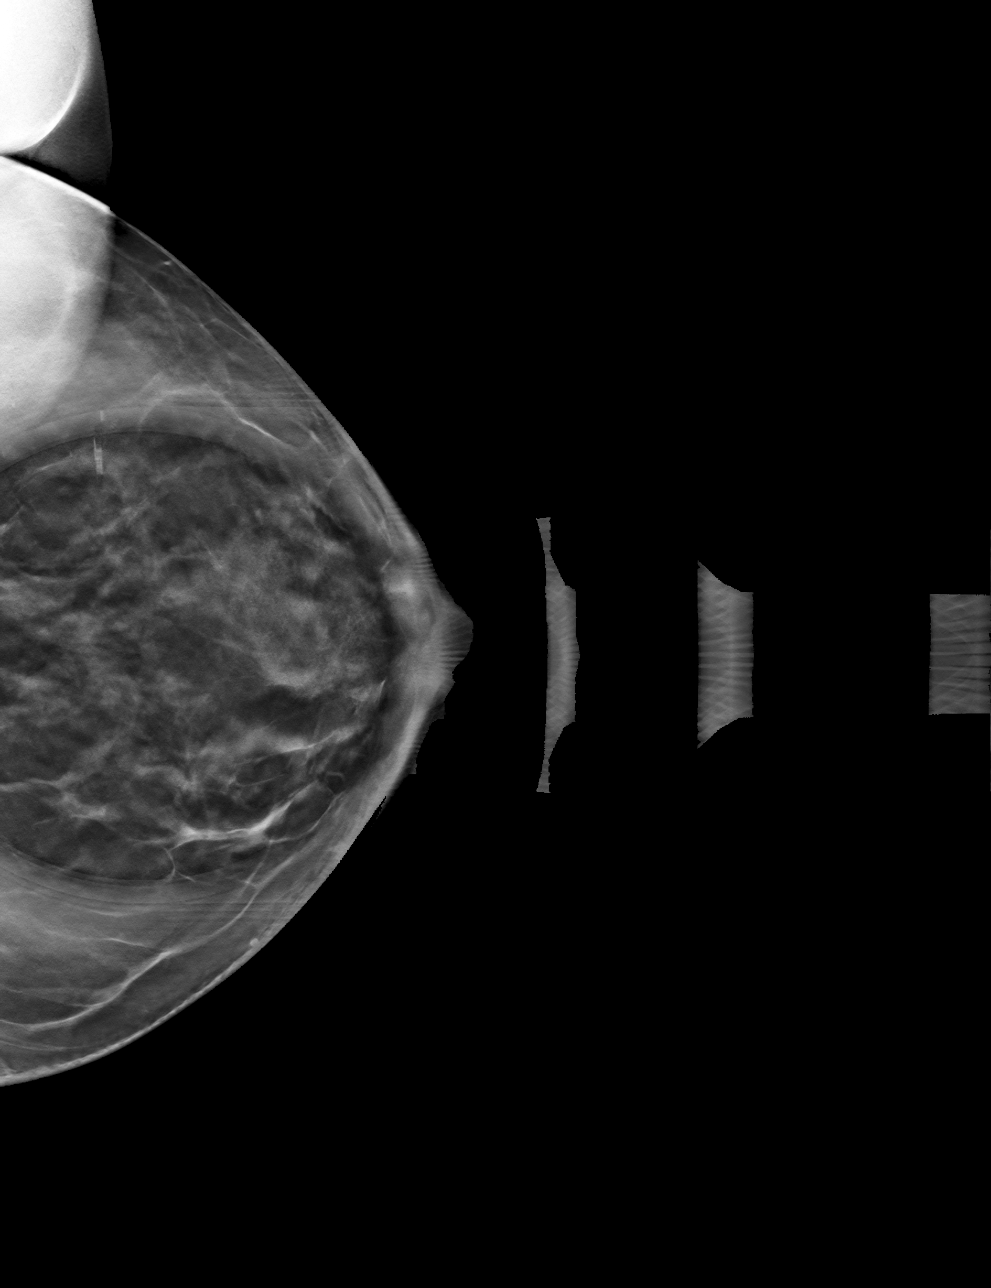

[L MLO tomo · tomo slice 33/65.0]
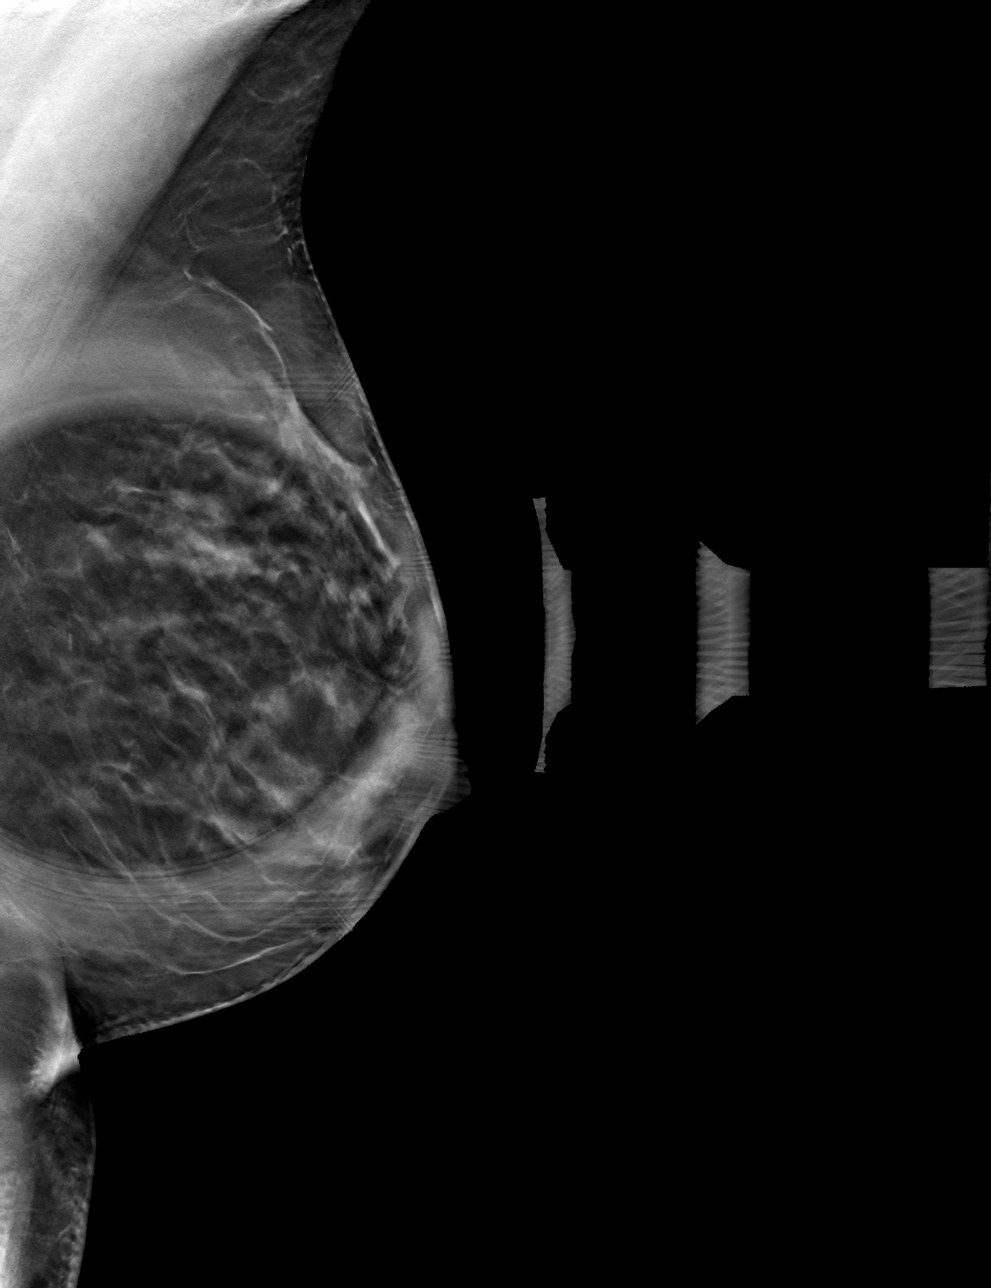

[4 of 12 positions shown; findings below may reference images not displayed]

ACR Breast Density Category c: The breast tissue is heterogeneously
dense, which may obscure small masses.
FINDINGS: The left breast asymmetry resolves into glandular tissue on today's
study.
IMPRESSION: No mammographic evidence of malignancy.

RECOMMENDATION:
Annual screening mammography.

I have discussed the findings and recommendations with the patient.
If applicable, a reminder letter will be sent to the patient
regarding the next appointment.

BI-RADS CATEGORY  2: Benign.

## 2023-02-28 ENCOUNTER — Encounter (HOSPITAL_BASED_OUTPATIENT_CLINIC_OR_DEPARTMENT_OTHER): Payer: Self-pay | Admitting: Obstetrics & Gynecology

## 2023-04-21 ENCOUNTER — Encounter (HOSPITAL_BASED_OUTPATIENT_CLINIC_OR_DEPARTMENT_OTHER): Payer: Self-pay | Admitting: Obstetrics & Gynecology

## 2023-04-23 ENCOUNTER — Ambulatory Visit (HOSPITAL_BASED_OUTPATIENT_CLINIC_OR_DEPARTMENT_OTHER): Payer: BC Managed Care – PPO | Admitting: Certified Nurse Midwife

## 2023-05-13 ENCOUNTER — Encounter (HOSPITAL_BASED_OUTPATIENT_CLINIC_OR_DEPARTMENT_OTHER): Payer: Self-pay | Admitting: Obstetrics & Gynecology

## 2023-05-13 ENCOUNTER — Other Ambulatory Visit (HOSPITAL_COMMUNITY)
Admission: RE | Admit: 2023-05-13 | Discharge: 2023-05-13 | Disposition: A | Discharge status: Home / Self Care | Payer: 59 | Source: Ambulatory Visit | Attending: Obstetrics & Gynecology | Admitting: Obstetrics & Gynecology

## 2023-05-13 ENCOUNTER — Ambulatory Visit (HOSPITAL_BASED_OUTPATIENT_CLINIC_OR_DEPARTMENT_OTHER): Payer: 59 | Admitting: Obstetrics & Gynecology

## 2023-05-13 VITALS — BP 124/84 | HR 78 | Ht 63.5 in | Wt 148.0 lb

## 2023-05-13 DIAGNOSIS — N764 Abscess of vulva: Secondary | ICD-10-CM | POA: Diagnosis not present

## 2023-05-13 DIAGNOSIS — Z01419 Encounter for gynecological examination (general) (routine) without abnormal findings: Secondary | ICD-10-CM

## 2023-05-13 DIAGNOSIS — G40909 Epilepsy, unspecified, not intractable, without status epilepticus: Secondary | ICD-10-CM

## 2023-05-13 DIAGNOSIS — Z124 Encounter for screening for malignant neoplasm of cervix: Secondary | ICD-10-CM | POA: Diagnosis present

## 2023-05-13 MED ORDER — SULFAMETHOXAZOLE-TRIMETHOPRIM 800-160 MG PO TABS: 1.0000 | ORAL_TABLET | Freq: Two times a day (BID) | ORAL | 0 refills | Status: DC

## 2023-05-13 NOTE — Progress Notes (Signed)
 43 y.o. G1P1 Married White or Caucasian female here for annual exam.  Doing well.  Has Mirena  IUD.  Has regular menstrual cycles with her IUD.  This was placed 04/2019.    H/o vulvar   She did have Covid this summer.  Had Covid vaccination in mid October.    No LMP recorded. (Menstrual status: IUD).          Sexually active: Yes.    The current method of family planning is IUD.    Smoker:  no  Health Maintenance: Pap:  07/18/2020 Negative History of abnormal Pap:  no MMG:  08/13/2022 Negative Colonoscopy:  guidelines reviewed Screening Labs: does with PCP   reports that she has never smoked. She has never used smokeless tobacco. She reports current alcohol use of about 5.0 standard drinks of alcohol per week. She reports that she does not use drugs.  Past Medical History:  Diagnosis Date   Anxiety    h/o anxiety/OCD   Epilepsy [facility])     Past Surgical History:  Procedure Laterality Date   BREAST BIOPSY Left 08/16/2020   CESAREAN SECTION     secondary FTP   excision of wart     from hand, age 5   mirena      IUD removal 2011, reinsertion 2013-5/15, reinsertion 04/2014   WISDOM TOOTH EXTRACTION      Current Outpatient Medications  Medication Sig Dispense Refill   divalproex (DEPAKOTE) 500 MG DR tablet Take 1 tablet by mouth 2 (two) times daily.     levonorgestrel  (MIRENA ) 20 MCG/24HR IUD by Intrauterine route. Placed 04/17/2014     No current facility-administered medications for this visit.    Family History  Problem Relation Age of Onset   Skin cancer Paternal Grandfather    Osteoarthritis Paternal Grandfather    Osteoarthritis Paternal Grandmother    Hypertension Maternal Grandmother    Osteoporosis Maternal Grandmother    Osteoarthritis Maternal Grandmother    Stroke Maternal Grandfather    Skin cancer Father    Skin cancer Mother    Hypertension Mother    Hypothyroidism Mother    Asthma Mother    Thyroid disease Mother    Breast cancer Maternal Aunt     Seizures Maternal Aunt     ROS: Constitutional: negative Genitourinary:negative  Exam:   BP 124/84 (BP Location: Right Arm, Patient Position: Sitting, Cuff Size: Large)   Pulse 78   Ht 5' 3.5 (1.613 m)   Wt 148 lb (67.1 kg)   BMI 25.81 kg/m   Height: 5' 3.5 (161.3 cm)  General appearance: alert, cooperative and appears stated age Head: Normocephalic, without obvious abnormality, atraumatic Neck: no adenopathy, supple, symmetrical, trachea midline and thyroid normal to inspection and palpation Lungs: clear to auscultation bilaterally Breasts: normal appearance, no masses or tenderness Heart: regular rate and rhythm Abdomen: soft, non-tender; bowel sounds normal; no masses,  no organomegaly Extremities: extremities normal, atraumatic, no cyanosis or edema Skin: Skin color, texture, turgor normal. No rashes or lesions Lymph nodes: Cervical, supraclavicular, and axillary nodes normal. No abnormal inguinal nodes palpated Neurologic: Grossly normal   Pelvic: External genitalia:  no lesions              Urethra:  normal appearing urethra with no masses, tenderness or lesions              Bartholins and Skenes: normal                 Vagina: normal appearing vagina with  normal color and no discharge, no lesions              Cervix: no lesions, IUD string noted              Pap taken: Yes.   Bimanual Exam:  Uterus:  normal size, contour, position, consistency, mobility, non-tender              Adnexa: normal adnexa and no mass, fullness, tenderness               Rectovaginal: Confirms               Anus:  normal sphincter tone, no lesions  Chaperone, Bascom Kotyk, CMA, was present for exam.  Assessment/Plan: 1. Well woman exam with routine gynecological exam (Primary) - Pap smear with high risk HPV - Mammogram 2024 - Colonoscopy guidelines reviewed - lab work done with PCP - vaccines reviewed/updated  2. Cervical cancer screening - Cytology - PAP( [place])  3.  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  Keeps abx on hand. - sulfamethoxazole -trimethoprim  (BACTRIM  DS) 800-160 MG tablet; Take 1 tablet by mouth 2 (two) times daily.  Dispense: 10 tablet; Refill: 0

## 2023-05-19 LAB — CYTOLOGY - PAP
Comment: NEGATIVE
Diagnosis: NEGATIVE
High risk HPV: NEGATIVE

## 2023-07-02 ENCOUNTER — Other Ambulatory Visit: Payer: Self-pay | Admitting: Obstetrics & Gynecology

## 2023-07-02 DIAGNOSIS — Z0000 Encounter for general adult medical examination without abnormal findings: Secondary | ICD-10-CM

## 2023-08-16 ENCOUNTER — Ambulatory Visit: Payer: 59

## 2023-08-19 ENCOUNTER — Ambulatory Visit
Admission: RE | Admit: 2023-08-19 | Discharge: 2023-08-19 | Disposition: A | Discharge status: Home / Self Care | Source: Ambulatory Visit | Attending: Obstetrics & Gynecology | Admitting: Obstetrics & Gynecology

## 2023-08-19 DIAGNOSIS — Z0000 Encounter for general adult medical examination without abnormal findings: Secondary | ICD-10-CM

## 2023-09-12 ENCOUNTER — Encounter (HOSPITAL_BASED_OUTPATIENT_CLINIC_OR_DEPARTMENT_OTHER): Payer: Self-pay | Admitting: Obstetrics & Gynecology

## 2023-09-14 NOTE — Telephone Encounter (Signed)
 Called pt in response to myChart message. Pt provided with appt to check IUD.

## 2023-09-15 ENCOUNTER — Ambulatory Visit (HOSPITAL_BASED_OUTPATIENT_CLINIC_OR_DEPARTMENT_OTHER): Admitting: Certified Nurse Midwife

## 2023-09-16 ENCOUNTER — Encounter (HOSPITAL_BASED_OUTPATIENT_CLINIC_OR_DEPARTMENT_OTHER): Payer: Self-pay | Admitting: Certified Nurse Midwife

## 2023-09-16 ENCOUNTER — Ambulatory Visit (HOSPITAL_BASED_OUTPATIENT_CLINIC_OR_DEPARTMENT_OTHER): Admitting: Certified Nurse Midwife

## 2023-09-16 VITALS — BP 106/80 | HR 78 | Ht 63.5 in | Wt 147.6 lb

## 2023-09-16 DIAGNOSIS — Z3202 Encounter for pregnancy test, result negative: Secondary | ICD-10-CM

## 2023-09-16 DIAGNOSIS — R300 Dysuria: Secondary | ICD-10-CM | POA: Diagnosis not present

## 2023-09-16 DIAGNOSIS — Z30431 Encounter for routine checking of intrauterine contraceptive device: Secondary | ICD-10-CM

## 2023-09-16 LAB — POCT URINALYSIS DIPSTICK
Bilirubin, UA: NEGATIVE
Glucose, UA: NEGATIVE
Ketones, UA: NEGATIVE
Nitrite, UA: NEGATIVE
Protein, UA: NEGATIVE
Spec Grav, UA: 1.02 (ref 1.010–1.025)
Urobilinogen, UA: 0.2 U/dL
pH, UA: 6 (ref 5.0–8.0)

## 2023-09-16 LAB — POCT URINE PREGNANCY: Preg Test, Ur: NEGATIVE

## 2023-09-16 MED ORDER — NITROFURANTOIN MONOHYD MACRO 100 MG PO CAPS
100.0000 mg | ORAL_CAPSULE | Freq: Two times a day (BID) | ORAL | 0 refills | Status: DC
Start: 2023-09-16 — End: 2023-09-29

## 2023-09-16 NOTE — Progress Notes (Signed)
    Subjective:     Madeline Figueroa is a 43 y.o. female MWF G1P1 who presents for evaluation of acute gyn issues. She has Mirena  IUD (04/2019). Her most recent annual gyn exam was January 2025. Pt reports she is feeling like something is blocking her stream of urine when she voids. She also reports she has a sensation that feels like a "tampon is the wrong way in my vagina".  She isn't sure if she is emptying her bladder completely. The urine looks different and may have an odor. Patient reports a hx of recurrent UTI (these are not her typical symptoms with UTI). She denies vaginal spotting or bleeding.    The following portions of the patient's history were reviewed and updated as appropriate: allergies, current medications, past family history, past medical history, past social history, past surgical history, and problem list.   Review of Systems Pertinent items are noted in HPI.    Objective:    BP 106/80 (BP Location: Right Arm, Patient Position: Sitting, Cuff Size: Large)   Pulse 78   Ht 5' 3.5" (1.613 m)   Wt 147 lb 9.6 oz (67 kg)   BMI 25.74 kg/m  General appearance: alert and cooperative Abdomen: soft, non-tender; bowel sounds normal; no masses,  no organomegaly Pelvic: cervix normal in appearance, external genitalia normal, no adnexal masses or tenderness, no cervical motion tenderness, rectovaginal septum normal, uterus normal size, shape, and consistency, vagina normal without discharge, and IUD strings are visible and palpable 2cm    Assessment:    Dysuria.  Surveillance IUD   Plan:    UPT Negative  UA dipstick with +blood and leuks Urine Culture pending Will send Rx Macrobid  100mg  po BID x 7 days (place on hold until Urine Culture results finalized) RTO for US  GYN to confirm proper IUD placement  Abe Abed K Verlean [place]

## 2023-09-18 LAB — URINE CULTURE: Organism ID, Bacteria: NO GROWTH

## 2023-09-20 ENCOUNTER — Encounter (HOSPITAL_BASED_OUTPATIENT_CLINIC_OR_DEPARTMENT_OTHER): Payer: Self-pay | Admitting: Certified Nurse Midwife

## 2023-09-21 ENCOUNTER — Ambulatory Visit (HOSPITAL_BASED_OUTPATIENT_CLINIC_OR_DEPARTMENT_OTHER): Admitting: Obstetrics & Gynecology

## 2023-09-29 ENCOUNTER — Ambulatory Visit (HOSPITAL_BASED_OUTPATIENT_CLINIC_OR_DEPARTMENT_OTHER): Admitting: Obstetrics & Gynecology

## 2023-09-29 ENCOUNTER — Ambulatory Visit (HOSPITAL_BASED_OUTPATIENT_CLINIC_OR_DEPARTMENT_OTHER): Payer: Self-pay | Admitting: Obstetrics & Gynecology

## 2023-09-29 ENCOUNTER — Ambulatory Visit (HOSPITAL_BASED_OUTPATIENT_CLINIC_OR_DEPARTMENT_OTHER)

## 2023-09-29 ENCOUNTER — Encounter (HOSPITAL_BASED_OUTPATIENT_CLINIC_OR_DEPARTMENT_OTHER): Payer: Self-pay | Admitting: Certified Nurse Midwife

## 2023-09-29 VITALS — BP 115/75 | HR 76 | Ht 64.0 in | Wt 150.2 lb

## 2023-09-29 DIAGNOSIS — N8301 Follicular cyst of right ovary: Secondary | ICD-10-CM

## 2023-09-29 DIAGNOSIS — N926 Irregular menstruation, unspecified: Secondary | ICD-10-CM

## 2023-09-29 DIAGNOSIS — Z30431 Encounter for routine checking of intrauterine contraceptive device: Secondary | ICD-10-CM | POA: Diagnosis not present

## 2023-09-29 DIAGNOSIS — R102 Pelvic and perineal pain: Secondary | ICD-10-CM

## 2023-09-29 DIAGNOSIS — T8332XA Displacement of intrauterine contraceptive device, initial encounter: Secondary | ICD-10-CM

## 2023-09-29 DIAGNOSIS — N83202 Unspecified ovarian cyst, left side: Secondary | ICD-10-CM | POA: Diagnosis not present

## 2023-10-02 DIAGNOSIS — T8332XA Displacement of intrauterine contraceptive device, initial encounter: Secondary | ICD-10-CM | POA: Insufficient documentation

## 2023-10-02 NOTE — Progress Notes (Signed)
 GYNECOLOGY  VISIT  CC:   Discuss ultrasound results, menstrual cycles changes  HPI: 43 y.o. G90P1001 Married White or Caucasian female here for discussion of ultrasound results.  Ultrasound obtained due to check for IUD placement due to menstrual cycle changes.    Pt doesn't feel IUD strings.  Uterus is ~8 x 4 x 5cm.  Endometrium is 2.26mm. Right ovary with two follicles.  Left ovary with cyst measuring 2.8cm.  No free fluid noted.  IUD is not clearly seen.  In some u/s views, end of IUD seems prsent but u/s does not clearly show presence of IUD so will proceed with checking x ray to confirm IUD placement.   Past Medical History:  Diagnosis Date   Anxiety    h/o anxiety/OCD   Epilepsy (HCC)     MEDS:   Current Outpatient Medications on File Prior to Visit  Medication Sig Dispense Refill   divalproex (DEPAKOTE) 500 MG DR tablet Take 1 tablet by mouth 2 (two) times daily.     levonorgestrel  (MIRENA ) 20 MCG/24HR IUD by Intrauterine route. Placed 04/17/2014     No current facility-administered medications on file prior to visit.    ALLERGIES: Lamictal [lamotrigine]  SH:  married, non smoker  Review of Systems  Constitutional: Negative.   Genitourinary:        Menstrual cycle changes    PHYSICAL EXAMINATION:    BP 115/75 (BP Location: Left Arm, Patient Position: Sitting, Cuff Size: Large)   Pulse 76   Ht 5\' 4"  (1.626 m) Comment: Reported  Wt 150 lb 3.2 oz (68.1 kg)   BMI 25.78 kg/m      Physical Exam Constitutional:      Appearance: Normal appearance.  Genitourinary:    Vagina: Normal.     Cervix: Normal.     Uterus: Normal.      Comments: IUD string not noted Neurological:     General: No focal deficit present.     Mental Status: She is alert.  Psychiatric:        Mood and Affect: Mood normal.    Chaperone present for exam.  Assessment/Plan: 1. Menstrual changes (Primary)   2. Intrauterine contraceptive device threads lost, initial encounter - will sent pt  for x ray of abdomen to hopefully confirm the IUD is in the correct location  Total time with pt:  23 minutes

## 2024-05-29 ENCOUNTER — Ambulatory Visit (HOSPITAL_BASED_OUTPATIENT_CLINIC_OR_DEPARTMENT_OTHER): Payer: Self-pay | Admitting: Obstetrics & Gynecology

## 2024-08-02 ENCOUNTER — Ambulatory Visit (HOSPITAL_BASED_OUTPATIENT_CLINIC_OR_DEPARTMENT_OTHER): Payer: Self-pay | Admitting: Obstetrics & Gynecology
